# Patient Record
Sex: Male | Born: 1985 | Race: White | Hispanic: No | Marital: Single | Smoking: Former smoker
Health system: Southern US, Community
[De-identification: ages and names within clinical notes are randomized; demographics above are authoritative.]

## PROBLEM LIST (undated history)

## (undated) HISTORY — PX: EYE SURGERY: SHX253

## (undated) HISTORY — PX: APPENDECTOMY: SHX54

## (undated) HISTORY — PX: FRACTURE SURGERY: SHX138

## (undated) HISTORY — PX: RECTAL SURGERY: SHX760

---

## 2015-10-28 DIAGNOSIS — A419 Sepsis, unspecified organism: Secondary | ICD-10-CM | POA: Diagnosis not present

## 2015-10-28 DIAGNOSIS — K610 Anal abscess: Secondary | ICD-10-CM | POA: Diagnosis not present

## 2020-01-10 ENCOUNTER — Emergency Department: Payer: BLUE CROSS/BLUE SHIELD

## 2020-01-10 ENCOUNTER — Emergency Department
Admission: EM | Admit: 2020-01-10 | Discharge: 2020-01-10 | Disposition: A | Discharge status: Home / Self Care | Payer: BLUE CROSS/BLUE SHIELD | Attending: Emergency Medicine | Admitting: Emergency Medicine

## 2020-01-10 ENCOUNTER — Encounter: Payer: Self-pay | Admitting: Emergency Medicine

## 2020-01-10 ENCOUNTER — Other Ambulatory Visit: Payer: Self-pay

## 2020-01-10 DIAGNOSIS — R42 Dizziness and giddiness: Secondary | ICD-10-CM | POA: Insufficient documentation

## 2020-01-10 DIAGNOSIS — Z87891 Personal history of nicotine dependence: Secondary | ICD-10-CM | POA: Diagnosis not present

## 2020-01-10 DIAGNOSIS — R0789 Other chest pain: Secondary | ICD-10-CM | POA: Insufficient documentation

## 2020-01-10 DIAGNOSIS — M5412 Radiculopathy, cervical region: Secondary | ICD-10-CM | POA: Insufficient documentation

## 2020-01-10 DIAGNOSIS — R079 Chest pain, unspecified: Secondary | ICD-10-CM | POA: Diagnosis present

## 2020-01-10 LAB — BASIC METABOLIC PANEL
Anion gap: 10 (ref 5–15)
BUN: 15 mg/dL (ref 6–20)
CO2: 27 mmol/L (ref 22–32)
Calcium: 9.3 mg/dL (ref 8.9–10.3)
Chloride: 101 mmol/L (ref 98–111)
Creatinine, Ser: 0.84 mg/dL (ref 0.61–1.24)
GFR calc Af Amer: >60 mL/min (ref >60)
GFR calc non Af Amer: >60 mL/min (ref >60)
Glucose, Bld: 89 mg/dL (ref 70–99)
Potassium: 3.9 mmol/L (ref 3.5–5.1)
Sodium: 138 mmol/L (ref 135–145)

## 2020-01-10 LAB — CBC
HCT: 46.8 % (ref 39.0–52.0)
Hemoglobin: 16.3 g/dL (ref 13.0–17.0)
MCH: 30.5 pg (ref 26.0–34.0)
MCHC: 34.8 g/dL (ref 30.0–36.0)
MCV: 87.6 fL (ref 80.0–100.0)
Platelets: 244 10*3/uL (ref 150–400)
RBC: 5.34 MIL/uL (ref 4.22–5.81)
RDW: 13.1 % (ref 11.5–15.5)
WBC: 7.6 10*3/uL (ref 4.0–10.5)
nRBC: 0 % (ref 0.0–0.2)

## 2020-01-10 LAB — TROPONIN I (HIGH SENSITIVITY): Troponin I (High Sensitivity): <2 ng/L (ref <18)

## 2020-01-10 MED ORDER — METHYLPREDNISOLONE 4 MG PO TABS: ORAL_TABLET | ORAL | 0 refills | Status: DC

## 2020-01-10 NOTE — ED Provider Notes (Addendum)
[facility] Emergency Department Provider Note ____________________________________________   First MD Initiated Contact with Patient 01/10/20 1545     (approximate)  I have reviewed the triage vital signs and the nursing notes.   HISTORY  Chief Complaint Chest Pain    HPI Ryan Griffith is a 34 y.o. male with PMH as noted below who presents with left-sided chest, arm, and leg pain over the last week, occurring intermittently, described as shooting pains.  He states he also has pain in the neck and feels like the pain to the chest and arm radiates from this.  He feels that the arm and leg are weaker and feels somewhat numb.  He denies any associated shortness of breath or nausea, but states that he sometimes feels dizzy when he has an episode of the pain.  He states that he was seen at an outside hospital several days ago and had a negative work-up including blood, chest x-ray, and a CAT scan.  The patient denies any recent trauma or injury.  History reviewed. No pertinent past medical history.  There are no problems to display for this patient.   Past Surgical History:  Procedure Laterality Date  . APPENDECTOMY    . EYE SURGERY    . FRACTURE SURGERY     hand  . RECTAL SURGERY      Prior to Admission medications   Medication Sig Start Date End Date Taking? Authorizing Provider  methylPREDNISolone (MEDROL) 4 MG tablet 6 tablets (24mg ) PO on day 1, 5 tabs (20mg ) on day 2, 4 tabs (16mg ) on day 3, 3 tabs (12mg ) on day 4, 2 tabs (8mg ) on day 5, 1 tab (4mg ) on day 6 01/10/20   , MD    Allergies Patient has no known allergies.  No family history on file.  Social History Social History   Tobacco Use  . Smoking status: Former  . Smokeless tobacco: Never Used  Substance Use Topics  . Alcohol use: Never  . Drug use: Not on file    Review of Systems  Constitutional: No fever/chills. Eyes: No visual changes. ENT: No  sore throat.  Positive for neck pain. Cardiovascular: Positive for chest pain. Respiratory: Denies shortness of breath. Gastrointestinal: No vomiting or diarrhea.  Genitourinary: Negative for flank pain.  Musculoskeletal: Negative for back pain.  Positive for left arm and leg pain. Skin: Negative for rash. Neurological: Positive for left arm and leg weakness and numbness.   ____________________________________________   PHYSICAL EXAM:  VITAL SIGNS: ED Triage Vitals  Enc Vitals Group     BP 01/10/20 1415 130/86     Pulse Rate 01/10/20 1415 86     Resp 01/10/20 1415 20     Temp 01/10/20 1415 98.3 F (36.8 C)     Temp Source 01/10/20 1415 Oral     SpO2 01/10/20 1415 96 %     Weight 01/10/20 1410 285 lb (129.3 kg)     Height 01/10/20 1410 6\' 1"  (1.854 m)     Head Circumference --      Peak Flow --      Pain Score 01/10/20 1410 7     Pain Loc --      Pain Edu? --      Excl. in GC? --     Constitutional: Alert and oriented. Well appearing and in no acute distress. Eyes: Conjunctivae are normal.  Head: Atraumatic. Nose: No congestion/rhinnorhea. Mouth/Throat: Mucous membranes are moist.   Neck: Normal  range of motion.  Cardiovascular: Normal rate, regular rhythm. Grossly normal heart sounds.  Good peripheral circulation. Respiratory: Normal respiratory effort.  No retractions. Lungs CTAB. Gastrointestinal:  No distention.  Genitourinary: No CVA tenderness. Musculoskeletal: No lower extremity edema.  Extremities warm and well perfused.  Full range of motion. Neurologic:  Normal speech and language.  4/5 grip strength to left arm.  5/5 strength of bilateral lower extremities.   Skin:  Skin is warm and dry. No rash noted. Psychiatric: Mood and affect are normal. Speech and behavior are normal.  ____________________________________________   LABS (all labs ordered are listed, but only abnormal results are displayed)  Labs Reviewed  BASIC METABOLIC PANEL  CBC  TROPONIN I  (HIGH SENSITIVITY)   ____________________________________________  EKG  ED ECG REPORT I, Dionne Bucy, the attending physician, personally viewed and interpreted this ECG.  Date: 01/10/2020 EKG Time: 1406 Rate: 89 Rhythm: normal sinus rhythm QRS Axis: normal Intervals: normal ST/T Wave abnormalities: normal Narrative Interpretation: no evidence of acute ischemia  ____________________________________________  RADIOLOGY  CXR: No focal infiltrate or edema MR cervical spine: Mild spondylosis with no significant canal or foraminal stenosis.  ____________________________________________   PROCEDURES  Procedure(s) performed: No  Procedures  Critical Care performed: No ____________________________________________   INITIAL IMPRESSION / ASSESSMENT AND PLAN / ED COURSE  Pertinent labs & imaging results that were available during my care of the patient were reviewed by me and considered in my medical decision making (see chart for details).  34 year old male with PMH as noted below presents with left-sided chest pain over the last week associated with arm and leg pain as well as some weakness and numbness in those extremities.  He reports that he had a negative work-up few days ago at an outside hospital.  I reviewed the past medical records in [place].  I am unable to see any records from his recent visit at [facility].  He was seen at [facility] for chest pain in December of last year with a negative work-up including a CT scan.  However, he states that that episode was quite different than what he is experiencing now.  On exam the patient is overall well-appearing.  His vital signs are normal.  The physical exam is remarkable for mild decreased grip strength in the left hand but no significant motor findings in the lower extremities.  The patient reports subjective numbness intermittently in both left-sided extremities.  Initial lab work-up including troponin as  well as chest x-ray and EKG are all within normal limits.  Overall presentation is not consistent with a cardiac or pulmonary etiology.  The patient is low risk for ACS and will have been ruled out twice.  He is PERC negative and self-reports a CT scan being done this week which was negative.  Overall I suspect cervical radiculopathy versus possible musculoskeletal etiology.  Given that the primary complaint is pain and the patient has no acute neurologic findings except for mild decreased left grip strength, there is no evidence of CNS cause.  We will obtain an MRI of the cervical spine for further evaluation.  ----------------------------------------- 7:31 PM on 01/10/2020 -----------------------------------------  MRI of the cervical spine is negative for concerning acute findings.  I discussed the patient's clinical presentation and the results of the work-up with Dr. Adriana Simas from neurosurgery.  He recommends placing the patient on a Medrol Dosepak, with follow-up within the next week.  I discussed the results of the work-up and the recommended plan of care with  the patient.  He is stable for discharge home at this time.  Return precautions given, and he expresses understanding.  ____________________________________________   FINAL CLINICAL IMPRESSION(S) / ED DIAGNOSES  Final diagnoses:  Cervical radiculopathy  Atypical chest pain      NEW MEDICATIONS STARTED DURING THIS VISIT:  New Prescriptions   METHYLPREDNISOLONE (MEDROL) 4 MG TABLET    6 tablets (24mg ) PO on day 1, 5 tabs (20mg ) on day 2, 4 tabs (16mg ) on day 3, 3 tabs (12mg ) on day 4, 2 tabs (8mg ) on day 5, 1 tab (4mg ) on day 6     Note:  This document was prepared using Dragon voice recognition software and may include unintentional dictation errors.    , MD 01/10/20    , MD 01/10/20 [phone]

## 2020-01-10 NOTE — Discharge Instructions (Signed)
Take the steroid tablets as prescribed over the next 6 days.  Return to the ER for new, worsening, or persistent severe pain, weakness or numbness to the left arm or leg, vision changes, difficulty speaking, dizziness or lightheadedness, difficulty walking, or any other new or worsening symptoms that concern you.  You should follow-up with the neurosurgeon in approximately 1 week.  If you do not receive a phone call from Dr. Patsey Berthold office within the next few days, call at the number provided in this paperwork to arrange a follow-up appointment.

## 2020-01-10 NOTE — ED Notes (Signed)
See triage note  Presents with pain to left side of body  States pain has been going on fro several days   Has been seen times 1 for same  States pain cont's   No fever or n/v

## 2020-01-10 NOTE — ED Triage Notes (Signed)
Patient to ER for c/o chest pain with left-sided body pain (entire left side). Patient states chest pain began last Monday, was seen at [facility] with all normal results. Denies N/V. +Shob.

## 2020-05-27 HISTORY — PX: APPENDECTOMY: SHX54

## 2020-11-15 ENCOUNTER — Other Ambulatory Visit: Payer: Self-pay

## 2020-11-15 ENCOUNTER — Encounter (HOSPITAL_COMMUNITY): Payer: Self-pay | Admitting: Emergency Medicine

## 2020-11-15 ENCOUNTER — Emergency Department (HOSPITAL_COMMUNITY): Payer: BLUE CROSS/BLUE SHIELD

## 2020-11-15 ENCOUNTER — Emergency Department (HOSPITAL_COMMUNITY)
Admission: EM | Admit: 2020-11-15 | Discharge: 2020-11-16 | Discharge status: Against Medical Advice | Payer: BLUE CROSS/BLUE SHIELD | Attending: Emergency Medicine | Admitting: Emergency Medicine

## 2020-11-15 DIAGNOSIS — R1011 Right upper quadrant pain: Secondary | ICD-10-CM

## 2020-11-15 DIAGNOSIS — K829 Disease of gallbladder, unspecified: Secondary | ICD-10-CM | POA: Insufficient documentation

## 2020-11-15 DIAGNOSIS — Z5321 Procedure and treatment not carried out due to patient leaving prior to being seen by health care provider: Secondary | ICD-10-CM | POA: Insufficient documentation

## 2020-11-15 LAB — CBC WITH DIFFERENTIAL/PLATELET
Abs Immature Granulocytes: 0.04 10*3/uL (ref 0.00–0.07)
Basophils Absolute: 0 10*3/uL (ref 0.0–0.1)
Basophils Relative: 0 %
Eosinophils Absolute: 0.1 10*3/uL (ref 0.0–0.5)
Eosinophils Relative: 1 %
HCT: 46.8 % (ref 39.0–52.0)
Hemoglobin: 16 g/dL (ref 13.0–17.0)
Immature Granulocytes: 0 %
Lymphocytes Relative: 26 %
Lymphs Abs: 2.4 10*3/uL (ref 0.7–4.0)
MCH: 29.8 pg (ref 26.0–34.0)
MCHC: 34.2 g/dL (ref 30.0–36.0)
MCV: 87.2 fL (ref 80.0–100.0)
Monocytes Absolute: 0.7 10*3/uL (ref 0.1–1.0)
Monocytes Relative: 7 %
Neutro Abs: 6 10*3/uL (ref 1.7–7.7)
Neutrophils Relative %: 66 %
Platelets: 235 10*3/uL (ref 150–400)
RBC: 5.37 MIL/uL (ref 4.22–5.81)
RDW: 14.2 % (ref 11.5–15.5)
WBC: 9.2 10*3/uL (ref 4.0–10.5)
nRBC: 0 % (ref 0.0–0.2)

## 2020-11-15 LAB — URINALYSIS, ROUTINE W REFLEX MICROSCOPIC
Bilirubin Urine: NEGATIVE
Glucose, UA: NEGATIVE mg/dL
Ketones, ur: NEGATIVE mg/dL
Leukocytes,Ua: NEGATIVE
Nitrite: NEGATIVE
Protein, ur: NEGATIVE mg/dL
Specific Gravity, Urine: 1.021 (ref 1.005–1.030)
pH: 6 (ref 5.0–8.0)

## 2020-11-15 LAB — COMPREHENSIVE METABOLIC PANEL
ALT: 26 U/L (ref 0–44)
AST: 28 U/L (ref 15–41)
Albumin: 4.2 g/dL (ref 3.5–5.0)
Alkaline Phosphatase: 50 U/L (ref 38–126)
Anion gap: 8 (ref 5–15)
BUN: 13 mg/dL (ref 6–20)
CO2: 27 mmol/L (ref 22–32)
Calcium: 9.6 mg/dL (ref 8.9–10.3)
Chloride: 104 mmol/L (ref 98–111)
Creatinine, Ser: 1.07 mg/dL (ref 0.61–1.24)
GFR, Estimated: >60 mL/min (ref >60)
Glucose, Bld: 105 mg/dL — ABNORMAL HIGH (ref 70–99)
Potassium: 3.6 mmol/L (ref 3.5–5.1)
Sodium: 139 mmol/L (ref 135–145)
Total Bilirubin: 0.7 mg/dL (ref 0.3–1.2)
Total Protein: 6.9 g/dL (ref 6.5–8.1)

## 2020-11-15 LAB — LIPASE, BLOOD: Lipase: 43 U/L (ref 11–51)

## 2020-11-15 MED ORDER — ONDANSETRON 4 MG PO TBDP: 4.0000 mg | ORAL_TABLET | Freq: Once | ORAL | Status: DC

## 2020-11-15 MED ORDER — OXYCODONE-ACETAMINOPHEN 5-325 MG PO TABS: 1.0000 | ORAL_TABLET | Freq: Once | ORAL | Status: DC

## 2020-11-15 NOTE — ED Triage Notes (Signed)
Pt  here from home with c/o abd pain was at [facility] on Monday and was told that it was his gall bladder and to return to the hospital if it got worse

## 2020-11-15 NOTE — ED Provider Notes (Signed)
Emergency Medicine Provider Triage Evaluation Note  Ryan Griffith , a 35 y.o. male  was evaluated in triage.  Pt complains of right upper quadrant abdominal pain and nausea/vomiting that began this morning.  He had been seen at [facility] earlier this week for similar symptoms and had been referred to general surgery, but he has been unable to schedule appointment with them.  His right upper quadrant abdominal pain radiates to his right subscapular region.  7/10 pain.   Given recurrent and worsening symptoms, he came to the ED for evaluation.  He states that his symptoms are worse with eating and drinking.  Denies any fevers or chills at home.  Review of Systems  Positive: RUQ abd pain, N/V Negative: Fevers/Chills  Physical Exam  BP 137/88 (BP Location: Right Arm)   Pulse 94   Temp 98.2 F (36.8 C) (Oral)   Resp 18   SpO2 96%  Gen:   Awake, no distress   Resp:  Normal effort  MSK:   Moves extremities without difficulty  Other:  Abd: TTP in RUQ. No guarding. No TTP in epigastrium or elsewhere.   Medical Decision Making  Medically screening exam initiated at 7:10 PM.  Appropriate orders placed.  Ryan Griffith was informed that the remainder of the evaluation will be completed by another provider, this initial triage assessment does not replace that evaluation, and the importance of remaining in the ED until their evaluation is complete.    Lorelee New, PA-C 11/15/20 1914    Mancel Bale, MD 11/16/20 [phone]

## 2020-11-16 NOTE — ED Notes (Signed)
Pt left due to wait time  

## 2021-02-09 ENCOUNTER — Ambulatory Visit: Payer: Self-pay | Admitting: Family Medicine

## 2021-02-09 NOTE — Progress Notes (Deleted)
  YOSHIHARU BRASSELL - 35 y.o. male MRN [id number]  Date of birth: April 09, 1986  SUBJECTIVE:  Including CC & ROS.  No chief complaint on file.   TILMAN MCCLAREN is a 35 y.o. male that is  ***.  ***   Review of Systems See HPI   HISTORY: Past Medical, Surgical, Social, and Family History Reviewed & Updated per EMR.   Pertinent Historical Findings include:  No past medical history on file.  Past Surgical History:  Procedure Laterality Date   APPENDECTOMY     EYE SURGERY     FRACTURE SURGERY     hand   RECTAL SURGERY      No family history on file.  Social History   Socioeconomic History   Marital status: Single    Spouse name: Not on file   Number of children: Not on file   Years of education: Not on file   Highest education level: Not on file  Occupational History   Not on file  Tobacco Use   Smoking status: Former   Smokeless tobacco: Never  Substance and Sexual Activity   Alcohol use: Never   Drug use: Not on file   Sexual activity: Not on file  Other Topics Concern   Not on file  Social History Narrative   Not on file   Social Determinants of Health   Financial Resource Strain: Not on file  Food Insecurity: Not on file  Transportation Needs: Not on file  Physical Activity: Not on file  Stress: Not on file  Social Connections: Not on file  Intimate Partner Violence: Not on file     PHYSICAL EXAM:  VS: There were no vitals taken for this visit. Physical Exam Gen: NAD, alert, cooperative with exam, well-appearing MSK:  ***      ASSESSMENT & PLAN:   No problem-specific Assessment & Plan notes found for this encounter.

## 2021-02-12 ENCOUNTER — Ambulatory Visit: Payer: Self-pay | Admitting: Family Medicine

## 2022-09-12 ENCOUNTER — Encounter: Payer: Self-pay | Admitting: *Deleted

## 2022-10-15 IMAGING — US US ABDOMEN LIMITED
1 series · 14 of 25 positions shown · non-contrast
Comparison: Ultrasound dated 11/13/2020.

CLINICAL DATA: 34-year-old male with right upper quadrant abdominal
pain.

EXAM:
ULTRASOUND ABDOMEN LIMITED RIGHT UPPER QUADRANT

[Series 1: us abdomen limited · 14 of 35 slices shown]
[im 1/35]
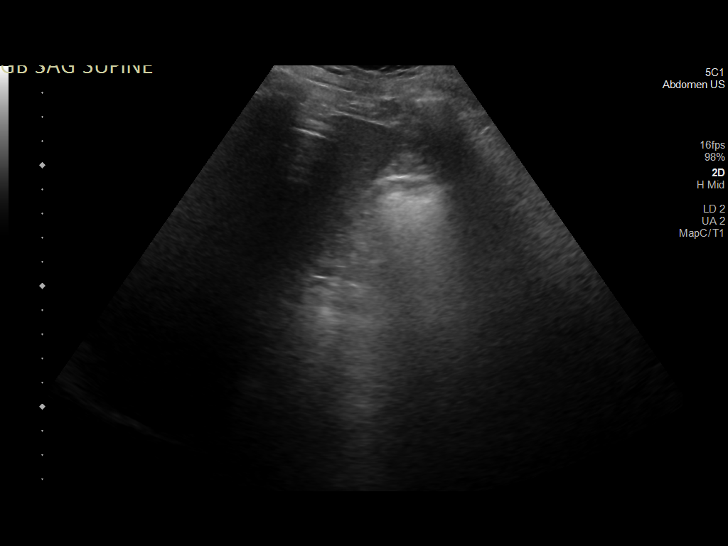
[im 3/35]
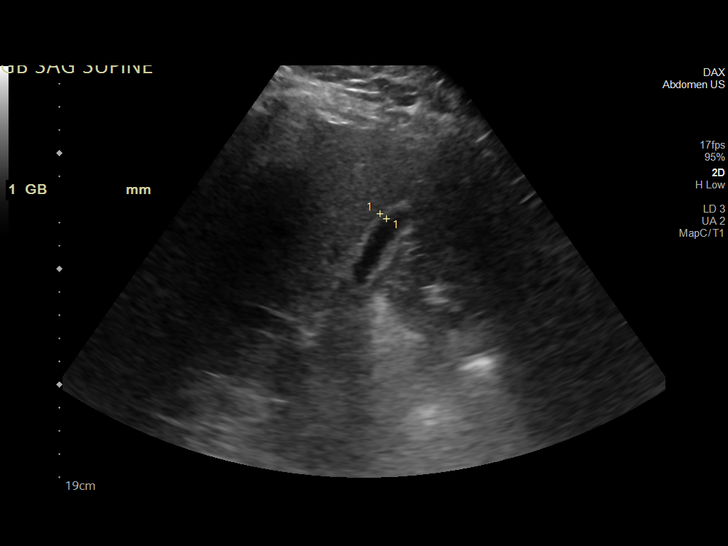
[im 6/35]
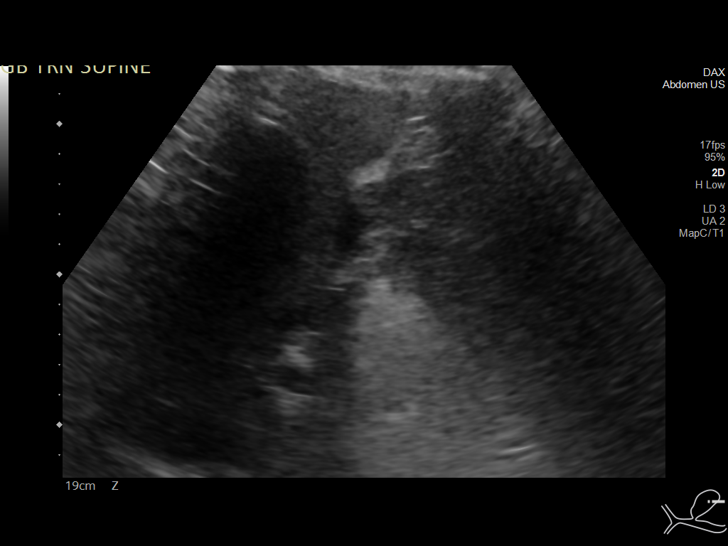
[im 9/35]
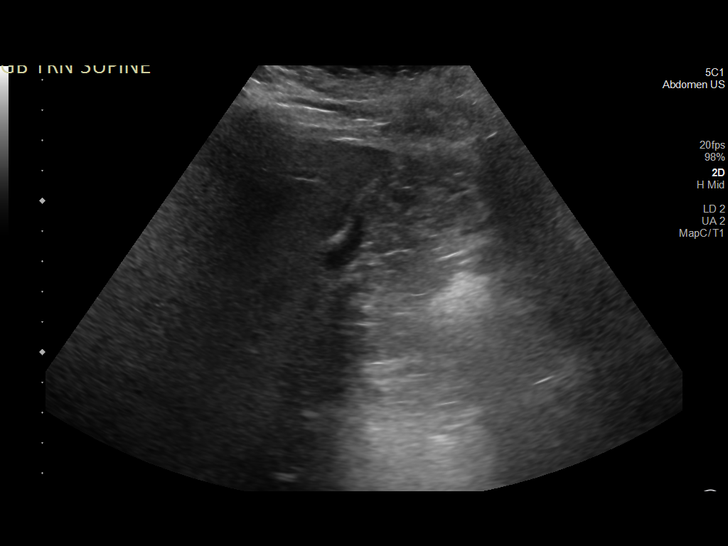
[im 12/35]
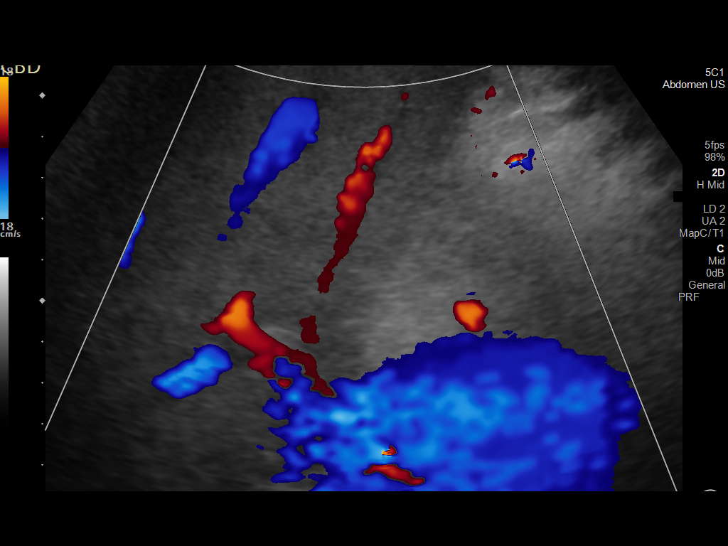
[im 13/35]
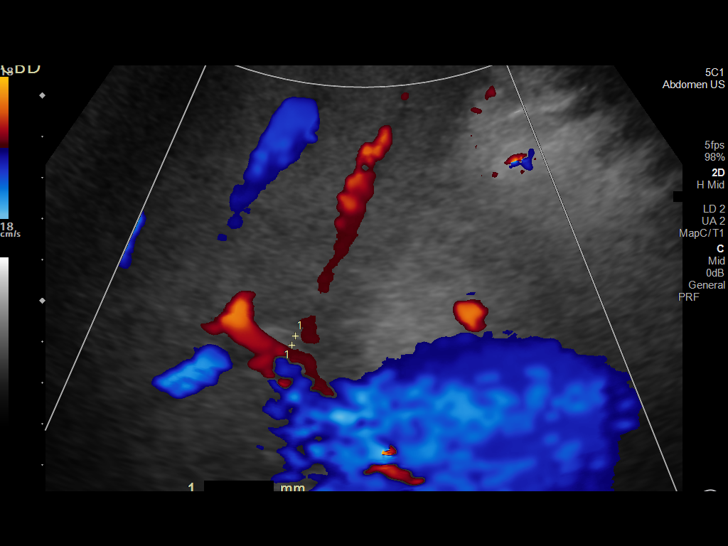
[im 16/35]
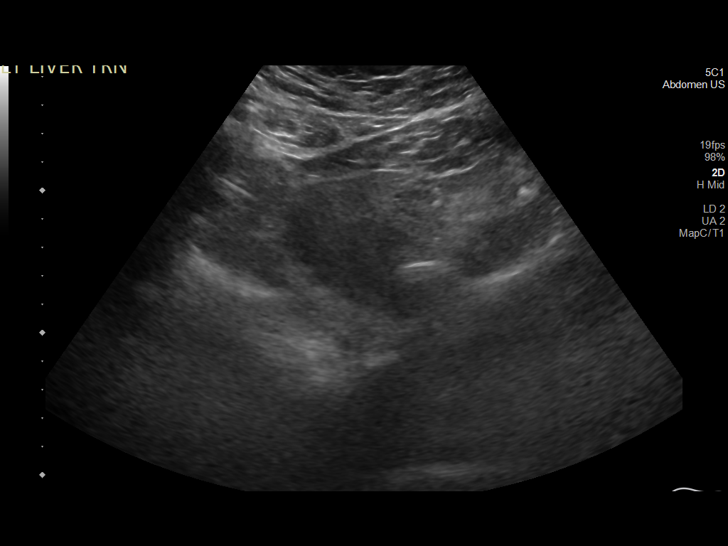
[im 19/35]
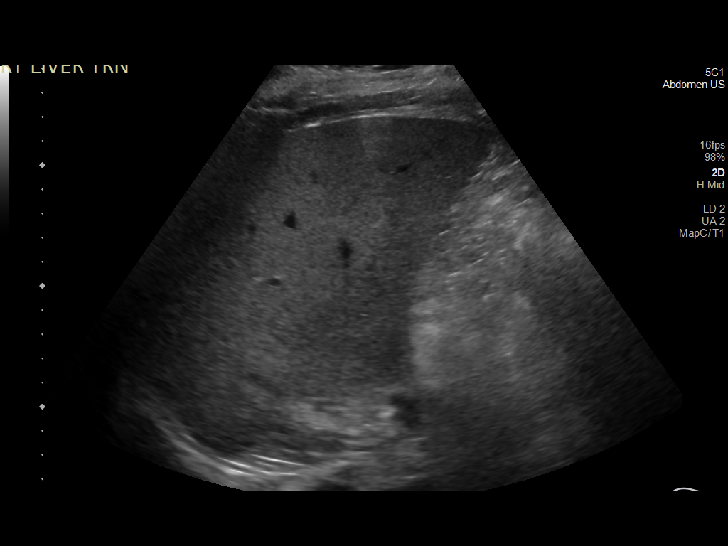
[im 22/35]
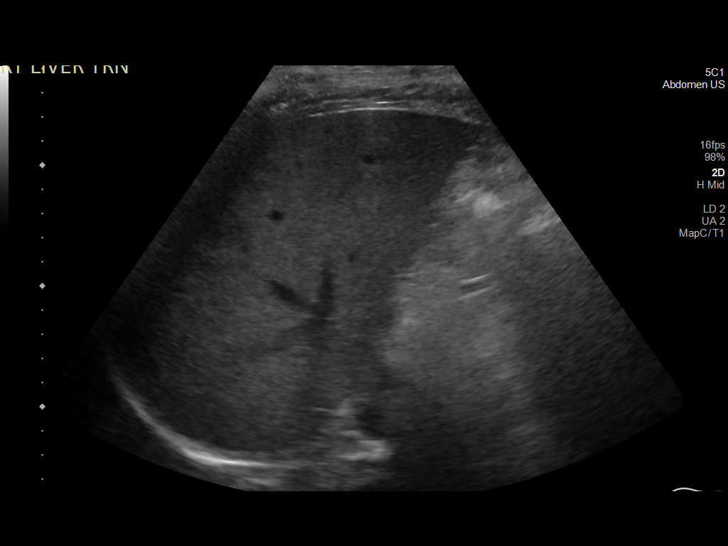
[im 23/35]
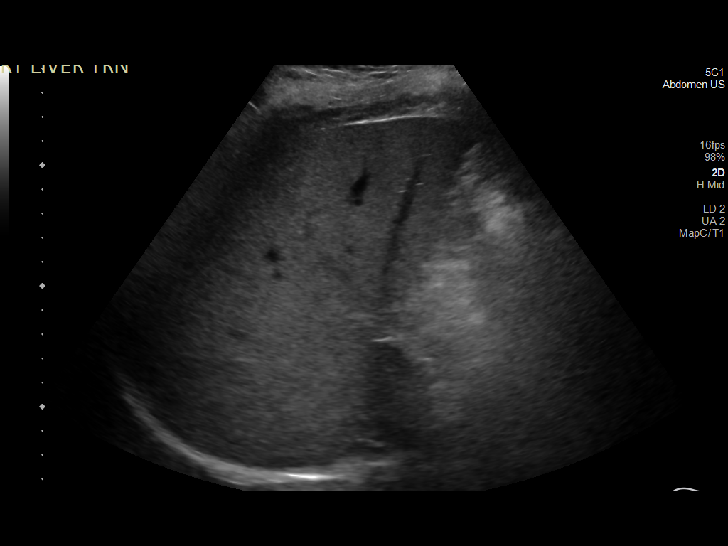
[im 26/35]
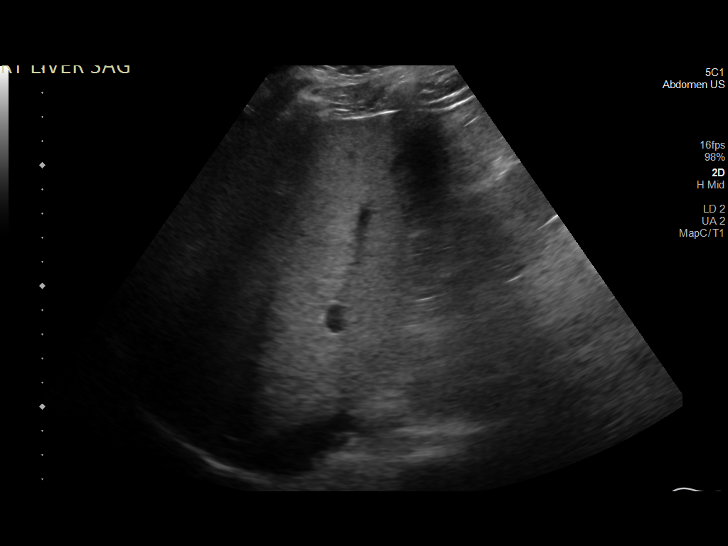
[im 29/35]
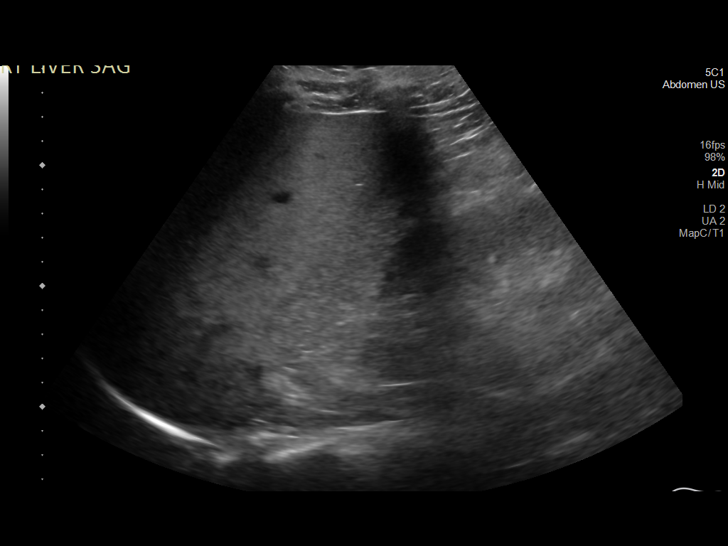
[im 32/35]
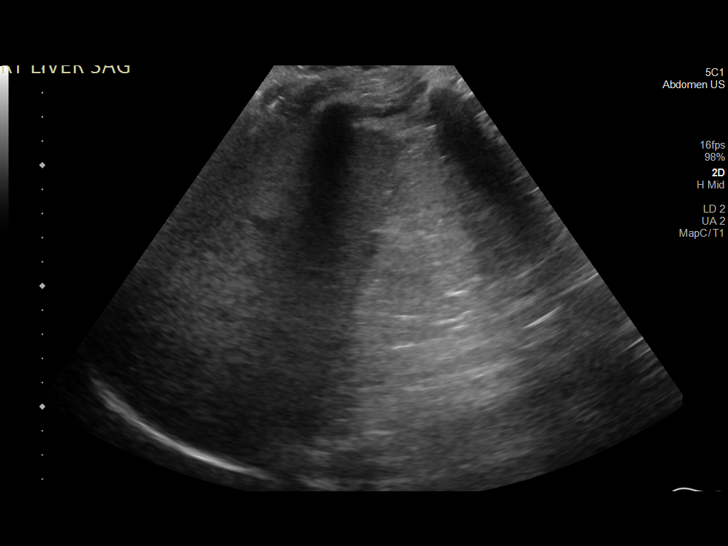
[im 35/35]
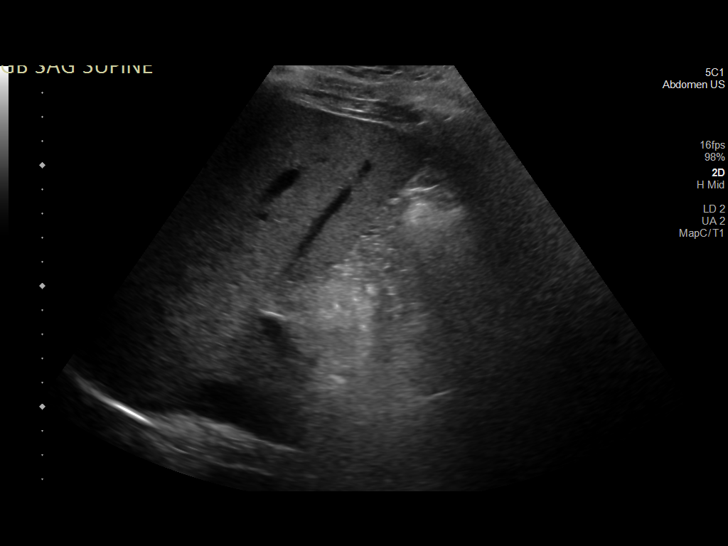

[14 of 25 positions shown; findings below may reference images not displayed]

FINDINGS: Gallbladder:

No gallstones or wall thickening visualized. No sonographic Murphy
sign noted by sonographer. The gallbladder is predominantly
contracted.

Common bile duct:

Diameter: 3 mm

Liver:

There is diffuse increased liver echogenicity most commonly seen in
the setting of fatty infiltration. Superimposed inflammation or
fibrosis is not excluded. Clinical correlation is recommended.
Portal vein is patent on color Doppler imaging with normal direction
of blood flow towards the liver.

Other: None.
IMPRESSION: Fatty liver, otherwise unremarkable right upper quadrant ultrasound.

## 2023-08-12 ENCOUNTER — Other Ambulatory Visit: Payer: Self-pay

## 2023-08-12 ENCOUNTER — Inpatient Hospital Stay (HOSPITAL_COMMUNITY)
Admission: EM | Admit: 2023-08-12 | Discharge: 2023-08-15 | DRG: 092 | Disposition: A | Discharge status: Home / Self Care | Payer: Self-pay | Attending: Internal Medicine | Admitting: Internal Medicine

## 2023-08-12 ENCOUNTER — Encounter (HOSPITAL_COMMUNITY): Payer: Self-pay

## 2023-08-12 DIAGNOSIS — R000 Tachycardia, unspecified: Secondary | ICD-10-CM | POA: Diagnosis present

## 2023-08-12 DIAGNOSIS — F40240 Claustrophobia: Secondary | ICD-10-CM | POA: Diagnosis present

## 2023-08-12 DIAGNOSIS — R509 Fever, unspecified: Secondary | ICD-10-CM | POA: Diagnosis present

## 2023-08-12 DIAGNOSIS — E871 Hypo-osmolality and hyponatremia: Principal | ICD-10-CM | POA: Diagnosis present

## 2023-08-12 DIAGNOSIS — Z9049 Acquired absence of other specified parts of digestive tract: Secondary | ICD-10-CM

## 2023-08-12 DIAGNOSIS — M25512 Pain in left shoulder: Secondary | ICD-10-CM | POA: Diagnosis present

## 2023-08-12 DIAGNOSIS — F17290 Nicotine dependence, other tobacco product, uncomplicated: Secondary | ICD-10-CM | POA: Diagnosis present

## 2023-08-12 DIAGNOSIS — R200 Anesthesia of skin: Secondary | ICD-10-CM | POA: Diagnosis not present

## 2023-08-12 DIAGNOSIS — Z6841 Body Mass Index (BMI) 40.0 and over, adult: Secondary | ICD-10-CM

## 2023-08-12 DIAGNOSIS — R030 Elevated blood-pressure reading, without diagnosis of hypertension: Secondary | ICD-10-CM | POA: Diagnosis present

## 2023-08-12 DIAGNOSIS — Z1152 Encounter for screening for COVID-19: Secondary | ICD-10-CM

## 2023-08-12 DIAGNOSIS — Z8719 Personal history of other diseases of the digestive system: Secondary | ICD-10-CM

## 2023-08-12 DIAGNOSIS — R29818 Other symptoms and signs involving the nervous system: Secondary | ICD-10-CM | POA: Diagnosis not present

## 2023-08-12 DIAGNOSIS — R0789 Other chest pain: Secondary | ICD-10-CM | POA: Diagnosis present

## 2023-08-12 DIAGNOSIS — E66813 Obesity, class 3: Secondary | ICD-10-CM | POA: Diagnosis present

## 2023-08-12 DIAGNOSIS — R519 Headache, unspecified: Secondary | ICD-10-CM | POA: Diagnosis present

## 2023-08-12 DIAGNOSIS — D72829 Elevated white blood cell count, unspecified: Secondary | ICD-10-CM | POA: Diagnosis present

## 2023-08-12 DIAGNOSIS — R2990 Unspecified symptoms and signs involving the nervous system: Secondary | ICD-10-CM | POA: Diagnosis present

## 2023-08-12 DIAGNOSIS — R0683 Snoring: Secondary | ICD-10-CM | POA: Diagnosis present

## 2023-08-12 DIAGNOSIS — R110 Nausea: Secondary | ICD-10-CM | POA: Diagnosis present

## 2023-08-12 MED ORDER — ACETAMINOPHEN 325 MG PO TABS
650.0000 mg | ORAL_TABLET | Freq: Four times a day (QID) | ORAL | Status: DC | PRN
  Administered 2023-08-12: 650 mg via ORAL
  Filled 2023-08-12: qty 2

## 2023-08-12 MED ORDER — ONDANSETRON HCL 4 MG/2ML IJ SOLN
4.0000 mg | Freq: Once | INTRAMUSCULAR | Status: AC
  Administered 2023-08-12: 4 mg via INTRAVENOUS
  Filled 2023-08-12: qty 2

## 2023-08-12 NOTE — ED Triage Notes (Signed)
 Pt BIB [facility] d/t Cp while driving radiating to left arm and left leg appraox 1 hour ago.  Pt has had nausea and has a fever of 100.4 in triage.  Pt was given 324 AsA PO and 1 nitroglycerin with EMS.  Pain decreased 10 to 7/10.  Also BP was 230/130 and decreased to 140/76 with EMS.  Pt states he has no hx of cardiac issues.

## 2023-08-13 ENCOUNTER — Emergency Department (HOSPITAL_COMMUNITY): Payer: Self-pay

## 2023-08-13 DIAGNOSIS — R200 Anesthesia of skin: Secondary | ICD-10-CM | POA: Diagnosis not present

## 2023-08-13 DIAGNOSIS — R2990 Unspecified symptoms and signs involving the nervous system: Secondary | ICD-10-CM | POA: Diagnosis present

## 2023-08-13 DIAGNOSIS — R531 Weakness: Secondary | ICD-10-CM | POA: Diagnosis not present

## 2023-08-13 DIAGNOSIS — R519 Headache, unspecified: Secondary | ICD-10-CM | POA: Diagnosis not present

## 2023-08-13 DIAGNOSIS — R079 Chest pain, unspecified: Secondary | ICD-10-CM | POA: Diagnosis not present

## 2023-08-13 LAB — CBC
HCT: 46.2 % (ref 39.0–52.0)
Hemoglobin: 16.5 g/dL (ref 13.0–17.0)
MCH: 31 pg (ref 26.0–34.0)
MCHC: 35.7 g/dL (ref 30.0–36.0)
MCV: 86.7 fL (ref 80.0–100.0)
Platelets: 240 10*3/uL (ref 150–400)
RBC: 5.33 MIL/uL (ref 4.22–5.81)
RDW: 13.2 % (ref 11.5–15.5)
WBC: 14 10*3/uL — ABNORMAL HIGH (ref 4.0–10.5)
nRBC: 0 % (ref 0.0–0.2)

## 2023-08-13 LAB — RAPID URINE DRUG SCREEN, HOSP PERFORMED
Amphetamines: NOT DETECTED
Barbiturates: NOT DETECTED
Benzodiazepines: NOT DETECTED
Cocaine: NOT DETECTED
Opiates: POSITIVE — AB
Tetrahydrocannabinol: NOT DETECTED

## 2023-08-13 LAB — URINALYSIS, ROUTINE W REFLEX MICROSCOPIC
Bacteria, UA: NONE SEEN
Bilirubin Urine: NEGATIVE
Glucose, UA: NEGATIVE mg/dL
Hgb urine dipstick: NEGATIVE
Ketones, ur: NEGATIVE mg/dL
Leukocytes,Ua: NEGATIVE
Nitrite: NEGATIVE
Protein, ur: NEGATIVE mg/dL
Specific Gravity, Urine: >1.046 — ABNORMAL HIGH (ref 1.005–1.030)
pH: 5 (ref 5.0–8.0)

## 2023-08-13 LAB — RESP PANEL BY RT-PCR (RSV, FLU A&B, COVID)  RVPGX2
Influenza A by PCR: NEGATIVE
Influenza B by PCR: NEGATIVE
Resp Syncytial Virus by PCR: NEGATIVE
SARS Coronavirus 2 by RT PCR: NEGATIVE

## 2023-08-13 LAB — BASIC METABOLIC PANEL
Anion gap: 11 (ref 5–15)
BUN: 17 mg/dL (ref 6–20)
CO2: 23 mmol/L (ref 22–32)
Calcium: 9.1 mg/dL (ref 8.9–10.3)
Chloride: 100 mmol/L (ref 98–111)
Creatinine, Ser: 1.12 mg/dL (ref 0.61–1.24)
GFR, Estimated: >60 mL/min (ref >60)
Glucose, Bld: 100 mg/dL — ABNORMAL HIGH (ref 70–99)
Potassium: 3.9 mmol/L (ref 3.5–5.1)
Sodium: 134 mmol/L — ABNORMAL LOW (ref 135–145)

## 2023-08-13 LAB — TROPONIN I (HIGH SENSITIVITY)
Troponin I (High Sensitivity): 5 ng/L (ref <18)
Troponin I (High Sensitivity): 6 ng/L (ref <18)

## 2023-08-13 LAB — HIV ANTIBODY (ROUTINE TESTING W REFLEX): HIV Screen 4th Generation wRfx: NONREACTIVE

## 2023-08-13 LAB — GLUCOSE, CAPILLARY: Glucose-Capillary: 102 mg/dL — ABNORMAL HIGH (ref 70–99)

## 2023-08-13 MED ORDER — ACETAMINOPHEN 325 MG PO TABS
650.0000 mg | ORAL_TABLET | Freq: Four times a day (QID) | ORAL | Status: DC | PRN
Start: 2023-08-13 — End: 2023-08-15
  Administered 2023-08-13 (×2): 650 mg via ORAL
  Filled 2023-08-13 (×2): qty 2

## 2023-08-13 MED ORDER — HYDROMORPHONE HCL 1 MG/ML IJ SOLN
1.0000 mg | Freq: Once | INTRAMUSCULAR | Status: AC
  Administered 2023-08-13: 1 mg via INTRAVENOUS
  Filled 2023-08-13: qty 1

## 2023-08-13 MED ORDER — HYDROMORPHONE HCL 1 MG/ML IJ SOLN
1.0000 mg | INTRAMUSCULAR | Status: DC | PRN
  Administered 2023-08-13 – 2023-08-14 (×7): 1 mg via INTRAVENOUS
  Filled 2023-08-13 (×7): qty 1

## 2023-08-13 MED ORDER — IOHEXOL 350 MG/ML SOLN
100.0000 mL | Freq: Once | INTRAVENOUS | Status: AC | PRN
  Administered 2023-08-13: 100 mL via INTRAVENOUS

## 2023-08-13 MED ORDER — SODIUM CHLORIDE 0.9% FLUSH
3.0000 mL | Freq: Two times a day (BID) | INTRAVENOUS | Status: DC
  Administered 2023-08-14 – 2023-08-15 (×2): 3 mL via INTRAVENOUS

## 2023-08-13 MED ORDER — LORAZEPAM 2 MG/ML IJ SOLN
2.0000 mg | Freq: Once | INTRAMUSCULAR | Status: AC
  Administered 2023-08-13: 2 mg via INTRAVENOUS
  Filled 2023-08-13: qty 1

## 2023-08-13 MED ORDER — DEXTROSE 5 % IV SOLN
10.0000 mg/kg | Freq: Three times a day (TID) | INTRAVENOUS | Status: DC
  Administered 2023-08-13 – 2023-08-14 (×2): 1060 mg via INTRAVENOUS
  Filled 2023-08-13: qty 10
  Filled 2023-08-13: qty 20
  Filled 2023-08-13 (×2): qty 21.2

## 2023-08-13 MED ORDER — GUAIFENESIN-DM 100-10 MG/5ML PO SYRP
10.0000 mL | ORAL_SOLUTION | ORAL | Status: DC | PRN
  Administered 2023-08-13 – 2023-08-14 (×2): 10 mL via ORAL
  Filled 2023-08-13 (×3): qty 10

## 2023-08-13 MED ORDER — LACTATED RINGERS IV BOLUS
1000.0000 mL | Freq: Once | INTRAVENOUS | Status: AC
  Administered 2023-08-13: 1000 mL via INTRAVENOUS

## 2023-08-13 MED ORDER — LORAZEPAM 2 MG/ML IJ SOLN: 2.0000 mg | Freq: Once | INTRAMUSCULAR | Status: DC

## 2023-08-13 MED ORDER — VANCOMYCIN HCL 10 G IV SOLR
2500.0000 mg | Freq: Once | INTRAVENOUS | Status: AC
  Administered 2023-08-13: 2500 mg via INTRAVENOUS
  Filled 2023-08-13: qty 2500

## 2023-08-13 MED ORDER — ONDANSETRON HCL 4 MG/2ML IJ SOLN
4.0000 mg | Freq: Four times a day (QID) | INTRAMUSCULAR | Status: DC | PRN
  Filled 2023-08-13: qty 2

## 2023-08-13 MED ORDER — SODIUM CHLORIDE 0.9 % IV SOLN: INTRAVENOUS | Status: DC

## 2023-08-13 MED ORDER — ONDANSETRON HCL 4 MG PO TABS: 4.0000 mg | ORAL_TABLET | Freq: Four times a day (QID) | ORAL | Status: DC | PRN

## 2023-08-13 MED ORDER — SODIUM CHLORIDE 0.9 % IV SOLN
2.0000 g | Freq: Two times a day (BID) | INTRAVENOUS | Status: DC
  Administered 2023-08-13 – 2023-08-14 (×3): 2 g via INTRAVENOUS
  Filled 2023-08-13 (×2): qty 20

## 2023-08-13 MED ORDER — VANCOMYCIN HCL 1500 MG/300ML IV SOLN
1500.0000 mg | Freq: Three times a day (TID) | INTRAVENOUS | Status: DC
  Administered 2023-08-14 (×2): 1500 mg via INTRAVENOUS
  Filled 2023-08-13 (×3): qty 300

## 2023-08-13 MED ORDER — KETOROLAC TROMETHAMINE 30 MG/ML IJ SOLN
15.0000 mg | Freq: Once | INTRAMUSCULAR | Status: AC
  Administered 2023-08-13: 15 mg via INTRAVENOUS
  Filled 2023-08-13: qty 1

## 2023-08-13 NOTE — ED Notes (Signed)
Neurology at beside.

## 2023-08-13 NOTE — Consult Note (Signed)
 NEUROLOGY CONSULT NOTE   Date of service: August 13, 2023 Patient Name: Ryan Griffith MRN:  [id number] DOB:  27-Mar-1986 Chief Complaint: "Headache, left arm weakness and numbness" Requesting Provider: Glade Lloyd, MD  History of Present Illness  Ryan Griffith is a 38 y.o. male with remote hx of appendicitis status post appendectomy who presents with sudden onset 8 out of 10 bifrontal headache that he describes as stabbing pain along with stabbing chest pain and left arm weakness and numbness.  He reports that he was driving around 4098 on 06/14/1476 when his symptoms occurred suddenly.  He reported nausea, was noted to be febrile with a fever of 100.4 in triage.  He was noted to be hypertensive on arrival with a systolic of 230s.  He denies any prior history of similar symptoms.  He does endorse feeling feverish and warm at home.  Here, his Tmax is 100.8 despite getting Tylenol.  He denies any recent URI-like symptoms including no sore throat, no runny nose,, no mucus production.  He denies any tick bites.  He denies using any recreational substances, he only socially drinks alcohol.  He reports that his typical diet is pizza and fast food.  He has nausea and has not seen a family doctor in a few years.  It seems like you told to admitting team that he had a headache for the last 2 to 3 weeks.  However, he denies that and tells me that his headache was sudden and maximal in onset and started at the same time as his chest pain and left-sided numbness and weakness.  LKW: 2100 on 08/12/2023 Modified rankin score: 0-Completely asymptomatic and back to baseline post- stroke IV Thrombolysis: Not offered, felt to be less likely to be stroke. EVT: Not offered, no LVO and low suspicion for stroke.     NIHSS components Score: Comment  1a Level of Conscious 0[x]  1[]  2[]  3[]      1b LOC Questions 0[x]  1[]  2[]       1c LOC Commands 0[x]  1[]  2[]       2 Best Gaze 0[x]  1[]  2[]       3 Visual  0[x]  1[]  2[]  3[]      4 Facial Palsy 0[x]  1[]  2[]  3[]      5a Motor Arm - left 0[x]  1[]  2[]  3[]  4[]  UN[]    5b Motor Arm - Right 0[x]  1[]  2[]  3[]  4[]  UN[]    6a Motor Leg - Left 0[x]  1[]  2[]  3[]  4[]  UN[]    6b Motor Leg - Right 0[x]  1[]  2[]  3[]  4[]  UN[]    7 Limb Ataxia 0[x]  1[]  2[]  3[]  UN[]     8 Sensory 0[]  1[]  2[x]  UN[]      9 Best Language 0[x]  1[]  2[]  3[]      10 Dysarthria 0[x]  1[]  2[]  UN[]      11 Extinct. and Inattention 0[x]  1[]  2[]       TOTAL: 2      ROS  Comprehensive ROS performed and pertinent positives documented in HPI   Past History  History reviewed. No pertinent past medical history.  Past Surgical History:  Procedure Laterality Date   APPENDECTOMY     EYE SURGERY     FRACTURE SURGERY     hand   RECTAL SURGERY      Family History: History reviewed. No pertinent family history.  Social History  reports that he has quit smoking. He has never used smokeless tobacco. He reports that he does not drink alcohol. No history on file for drug use.  No Known Allergies  Medications   Current Facility-Administered Medications:    acetaminophen (TYLENOL) tablet 650 mg, 650 mg, Oral, Q6H PRN, Hanley Ben, Kshitiz, MD, 650 mg at 08/13/23 1135   cefTRIAXone (ROCEPHIN) 2 g in sodium chloride 0.9 % 100 mL IVPB, 2 g, Intravenous, BID, Russella Dar, NP, Stopped at 08/13/23 1111   HYDROmorphone (DILAUDID) injection 1 mg, 1 mg, Intravenous, Q2H PRN, Russella Dar, NP, 1 mg at 08/13/23 1222   ondansetron (ZOFRAN) tablet 4 mg, 4 mg, Oral, Q6H PRN **OR** ondansetron (ZOFRAN) injection 4 mg, 4 mg, Intravenous, Q6H PRN, Russella Dar, NP   sodium chloride flush (NS) 0.9 % injection 3 mL, 3 mL, Intravenous, Q12H, Russella Dar, NP No current outpatient medications on file.  Vitals   Vitals:   08/13/23 0800 08/13/23 0900 08/13/23 1132 08/13/23 1225  BP: 116/76 115/76  133/66  Pulse: (!) 127 (!) 127  (!) 112  Resp:  20  18  Temp:   (!) 100.8 F (38.2 C)   TempSrc:   Oral    SpO2: 97% 99%  90%  Weight:      Height:        Body mass index is 42.22 kg/m.  Physical Exam   General: Holding his forehead in his hands.endorses sensitivity to light.  In no acute distress.  HENT: Normal oropharynx and mucosa. Normal external appearance of ears and nose.  Neck: Supple, no pain or tenderness  CV: No JVD. No peripheral edema.  Pulmonary: Symmetric Chest rise. Normal respiratory effort.  Abdomen: Soft to touch, non-tender.  Ext: No cyanosis, edema, or deformity  Skin: No rash. Normal palpation of skin.   Musculoskeletal: Normal digits and nails by inspection. No clubbing.   Neurologic Examination  Mental status/Cognition: Alert, oriented to self, place, month and year, good attention.  Speech/language: Fluent, comprehension intact, object naming intact, repetition intact.  Cranial nerves:   CN II Pupils equal and reactive to light, no VF deficits    CN III,IV,VI EOM intact, no gaze preference or deviation, no nystagmus    CN V normal sensation in V1, V2, and V3 segments bilaterally    CN VII no asymmetry, no nasolabial fold flattening    CN VIII normal hearing to speech    CN IX & X normal palatal elevation, no uvular deviation    CN XI 5/5 head turn and 5/5 shoulder shrug bilaterally    CN XII midline tongue protrusion    Motor:  Muscle bulk: Normal tone normal, pronator drift none tremor none Mvmt Root Nerve  Muscle Right Left Comments  SA C5/6 Ax Deltoid 5 4   EF C5/6 Mc Biceps 5 4   EE C6/7/8 Rad Triceps 5 4   WF C6/7 Med FCR     WE C7/8 PIN ECU     F Ab C8/T1 U ADM/FDI 5 4   HF L1/2/3 Fem Illopsoas 5 4   KE L2/3/4 Fem Quad 5 4   DF L4/5 D Peron Tib Ant 5 4   PF S1/2 Tibial Grc/Sol 5 4    Sensation:  Light touch Endorses absent to touch in left upper extremity left lower extremity.   Pin prick    Temperature    Vibration   Proprioception    Coordination/Complex Motor:  - Finger to Nose intact bilaterally - Heel to shin intact  bilaterally - Rapid alternating movement are slower on the left - Gait: Deferred for patient safety. Labs/Imaging/Neurodiagnostic studies   CBC:  Recent Labs  Lab 08/12/23 2353  WBC 14.0*  HGB 16.5  HCT 46.2  MCV 86.7  PLT 240   Basic Metabolic Panel:  Lab Results  Component Value Date   NA 134 (L) 08/12/2023   K 3.9 08/12/2023   CO2 23 08/12/2023   GLUCOSE 100 (H) 08/12/2023   BUN 17 08/12/2023   CREATININE 1.12 08/12/2023   CALCIUM 9.1 08/12/2023   GFRNONAA >60 08/12/2023   GFRAA >60 01/10/2020   Lipid Panel: No results found for: "LDLCALC" HgbA1c: No results found for: "HGBA1C" Urine Drug Screen:     Component Value Date/Time   LABOPIA POSITIVE (A) 08/13/2023 0755   COCAINSCRNUR NONE DETECTED 08/13/2023 0755   LABBENZ NONE DETECTED 08/13/2023 0755   AMPHETMU NONE DETECTED 08/13/2023 0755   THCU NONE DETECTED 08/13/2023 0755   LABBARB NONE DETECTED 08/13/2023 0755    Alcohol Level No results found for: "ETH" INR No results found for: "INR" APTT No results found for: "APTT" AED levels: No results found for: "PHENYTOIN", "ZONISAMIDE", "LAMOTRIGINE", "LEVETIRACETA"  CT Head without contrast(Personally reviewed): CTH was negative for a large hypodensity concerning for a large territory infarct or hyperdensity concerning for an ICH  CT angio Head and Neck with contrast(Personally reviewed): No LVO, no aneurysm, no vasospasm noted.  MRI Brain: Pending  ASSESSMENT   FARRELL PANTALEO is a 38 y.o. male with remote hx of appendicitis status post appendectomy who presents with sudden onset 8 out of 10 bifrontal headache that he describes as stabbing pain maximal at onset along with stabbing chest pain and left arm weakness and numbness.  Neuroexam notable for effort dependent left arm and left leg weakness and numbness. He endorses significant photophobia. No prior similar headache. No hx of MS, no hx of dissection.  Differential is quite broad, aortic  dissection has been ruled out with a CT chest abdomen pelvis.  Potential subarachnoid hemorrhage or RCVS is still on the differential but with the fact that he had a CT head within 6 hours of onset of symptoms which was negative for hemorrhage, low overall suspicion for an aneurysmal subarachnoid hemorrhage.  CT angio the head and neck also did not show any significant vasospasm or any aneurysms.  MRI was attempted and Ativan, but despite Ativan on board, he was unable to tolerate MRI due to severe claustrophobia.  With this said, his weakness does appear to have a giveaway component on my exam and appears to improve when he is distracted or retesting.  This raises suspicion for there being underlying functional etiologies of his presentation.  However, I cannot ignore the fact that he does have fever and elevated white cell count on his blood work with no clear source, this maybe concerning for a potential meningitis or discitis.  His neuroexam does not show nuchal rigidity, but he does have some neck tenderness, negative Kernig's and Brudzinski's sign, he is not encephalopathic or altered in any way, he does continue to still report a bifrontal headache.  RECOMMENDATIONS  -Agree with MRI of the brain and cervical spine with and without contrast. This will need to be done under general anesthesia due to claustrophobia. -I have ordered lumbar puncture under fluoroscopy.  He will not be able to get it at the bedside with his elevated BMI of 42. IR will need to coordinate this with anesthesia team and MRI team. -CSF cell count, differential, protein, glucose, meningitis/encephalitis biofire panel, CSF gram stain and cultures, CSF opening pressure. - Will get RPR, HIV. -  Empiric meningitis coverage with Vanc, ceftriaxone, Acyclovir. Wean or narrow after LP. ______________________________________________________________________  Plan discussed with patient, his father at the bedside and with hospital team  later over secure chat.  Signed, Erick Blinks, MD [facility]

## 2023-08-13 NOTE — ED Notes (Signed)
 Assumed pt care from [facility]

## 2023-08-13 NOTE — ED Notes (Signed)
 Patient transported to MRI

## 2023-08-13 NOTE — Consult Note (Signed)
 Chief Complaint: Patient was seen in consultation today for headaches, left sided weakness and fevers.   at the request of Erick Blinks, MD  Referring Physician(s): Erick Blinks, MD  Supervising Physician: Ruel Favors  Patient Status: [facility] - In-pt  Full Code  History of Present Illness: Ryan Griffith is a 38 y.o. male without significant medical history. Patient presented to the ER 3/19 with chest pain, left sided extremity weakness and nausea. Patient underwent extensive workup, which included a EKG, CT head, CT angio head/neck, chest xray, and  CT angio chest/abdomen/pelvis. These test were unremarkable for acute findings. Neurology was consulted and recommended MRI of the brain and cervical spine with and without contrast. LP under fluoroscopy recommended. IR consulted to assist with the lumbar puncture and coordinate this with anesthesia.   Patient reports currently that he has a headache, neck pain, visual changes and a cough. He denies any recent illnesses. Patient notes having chest pain and nausea. Patient denies vomiting and abdominal pain.   History reviewed. No pertinent past medical history.  Past Surgical History:  Procedure Laterality Date   APPENDECTOMY     EYE SURGERY     FRACTURE SURGERY     hand   RECTAL SURGERY      Allergies: Patient has no known allergies.  Medications: Prior to Admission medications   Not on File     History reviewed. No pertinent family history.  Social History   Socioeconomic History   Marital status: Single    Spouse name: Not on file   Number of children: Not on file   Years of education: Not on file   Highest education level: Not on file  Occupational History   Not on file  Tobacco Use   Smoking status: Former   Smokeless tobacco: Never  Substance and Sexual Activity   Alcohol use: Never   Drug use: Not on file   Sexual activity: Not on file  Other Topics Concern   Not on file  Social  History Narrative   Not on file   Social Drivers of Health   Financial Resource Strain: Not on file  Food Insecurity: No Food Insecurity (08/13/2023)   Hunger Vital Sign    Worried About Running Out of Food in the Last Year: Never true    Ran Out of Food in the Last Year: Never true  Transportation Needs: No Transportation Needs (08/13/2023)   PRAPARE - Administrator, Civil Service (Medical): No    Lack of Transportation (Non-Medical): No  Physical Activity: Not on file  Stress: Not on file  Social Connections: Unknown (10/08/2021)   Received from [facility]   Social Network    Social Network: Not on file    ECOG Status: Review of Systems: A 12 point ROS discussed and pertinent positives are indicated in the HPI above.  All other systems are negative.  Review of Systems  Constitutional:  Positive for fever.  Eyes:  Positive for visual disturbance.  Respiratory:  Positive for cough. Negative for shortness of breath.   Cardiovascular:  Positive for chest pain.  Gastrointestinal:  Positive for nausea. Negative for diarrhea and vomiting.  Musculoskeletal:  Positive for neck pain.  Neurological:  Positive for weakness (left side) and headaches.    Vital Signs: BP 133/66 (BP Location: Left Arm)   Pulse (!) 112   Temp (!) 100.8 F (38.2 C) (Oral)   Resp 18   Ht 6\' 1"  (1.854 m)   Wt Marland Kitchen)  320 lb (145.2 kg)   SpO2 90%   BMI 42.22 kg/m     Physical Exam HENT:     Head: Normocephalic and atraumatic.  Cardiovascular:     Rate and Rhythm: Regular rhythm. Tachycardia present.  Pulmonary:     Effort: Pulmonary effort is normal.     Breath sounds: Normal breath sounds.  Skin:    General: Skin is warm.  Neurological:     Mental Status: He is alert and oriented to person, place, and time.  Psychiatric:        Mood and Affect: Mood normal.        Behavior: Behavior normal.     Imaging: CT ANGIO HEAD NECK W WO CM Result Date: 08/13/2023 CLINICAL DATA:   Initial evaluation for acute neuro deficit, stroke suspected. EXAM: CT ANGIOGRAPHY HEAD AND NECK WITH AND WITHOUT CONTRAST TECHNIQUE: Multidetector CT imaging of the head and neck was performed using the standard protocol during bolus administration of intravenous contrast. Multiplanar CT image reconstructions and MIPs were obtained to evaluate the vascular anatomy. Carotid stenosis measurements (when applicable) are obtained utilizing NASCET criteria, using the distal internal carotid diameter as the denominator. RADIATION DOSE REDUCTION: This exam was performed according to the departmental dose-optimization program which includes automated exposure control, adjustment of the mA and/or kV according to patient size and/or use of iterative reconstruction technique. CONTRAST:  OMNIPAQUE IOHEXOL 350 MG/ML SOLN COMPARISON:  Comparison made with head CT performed on the same day. FINDINGS: CTA NECK FINDINGS Aortic arch: Standard branching. Imaged portion shows no evidence of aneurysm or dissection. No significant stenosis of the major arch vessel origins. Right carotid system: No evidence of dissection, stenosis (50% or greater), or occlusion. Left carotid system: No evidence of dissection, stenosis (50% or greater), or occlusion. Vertebral arteries: No evidence of dissection, stenosis (50% or greater), or occlusion. Skeleton: No discrete or worrisome osseous lesions. Other neck: No other acute finding. Upper chest: No other acute finding. Review of the MIP images confirms the above findings CTA HEAD FINDINGS Anterior circulation: Both internal carotid arteries widely patent to the termini without stenosis. A1 segments widely patent. Normal anterior communicating artery complex. Both anterior cerebral arteries widely patent to their distal aspects without stenosis. No M1 stenosis or occlusion. Normal MCA bifurcations. Distal MCA branches well perfused and symmetric. Posterior circulation: Both V4 segments patent  to the vertebrobasilar junction without stenosis. Both PICA origins patent and normal. Basilar widely patent to its distal aspect without stenosis. Superior cerebellar arteries patent bilaterally. Both PCAs primarily supplied via the basilar and are well perfused to there distal aspects. Venous sinuses: Patent allowing for timing the contrast bolus. Anatomic variants: None significant.  No aneurysm. Review of the MIP images confirms the above findings IMPRESSION: Normal CTA of the head and neck. No large vessel occlusion, hemodynamically significant stenosis, or other acute vascular abnormality. Electronically Signed   By: Rise Mu M.D.   On: 08/13/2023 02:10   CT Head Wo Contrast Result Date: 08/13/2023 CLINICAL DATA:  Headache EXAM: CT HEAD WITHOUT CONTRAST TECHNIQUE: Contiguous axial images were obtained from the base of the skull through the vertex without intravenous contrast. RADIATION DOSE REDUCTION: This exam was performed according to the departmental dose-optimization program which includes automated exposure control, adjustment of the mA and/or kV according to patient size and/or use of iterative reconstruction technique. COMPARISON:  None Available. FINDINGS: Brain: No acute intracranial abnormality. Specifically, no hemorrhage, hydrocephalus, mass lesion, acute infarction, or significant intracranial injury. Vascular:  No hyperdense vessel or unexpected calcification. Skull: No acute calvarial abnormality. Sinuses/Orbits: No acute findings Other: None IMPRESSION: No acute intracranial abnormality. Electronically Signed   By: Charlett Nose M.D.   On: 08/13/2023 01:32   CT Angio Chest/Abd/Pel for Dissection W and/or Wo Contrast Result Date: 08/13/2023 CLINICAL DATA:  Acute aortic syndrome (AAS) suspected. Chest pain radiating to left arm and left leg. Nausea. EXAM: CT ANGIOGRAPHY CHEST, ABDOMEN AND PELVIS TECHNIQUE: Non-contrast CT of the chest was initially obtained. Multidetector CT  imaging through the chest, abdomen and pelvis was performed using the standard protocol during bolus administration of intravenous contrast. Multiplanar reconstructed images and MIPs were obtained and reviewed to evaluate the vascular anatomy. RADIATION DOSE REDUCTION: This exam was performed according to the departmental dose-optimization program which includes automated exposure control, adjustment of the mA and/or kV according to patient size and/or use of iterative reconstruction technique. CONTRAST:  OMNIPAQUE IOHEXOL 350 MG/ML SOLN COMPARISON:  06/01/2023 FINDINGS: CTA CHEST FINDINGS Cardiovascular: Heart is normal size. Aorta is normal caliber. No evidence of dissection. No filling defects in the pulmonary arteries to suggest pulmonary emboli. Mediastinum/Nodes: No mediastinal, hilar, or axillary adenopathy. Trachea and esophagus are unremarkable. Thyroid unremarkable. Lungs/Pleura: Lungs are clear. No focal airspace opacities or suspicious nodules. No effusions. No pneumothorax. Musculoskeletal: Chest wall soft tissues are unremarkable. No acute bony abnormality. Review of the MIP images confirms the above findings. CTA ABDOMEN AND PELVIS FINDINGS VASCULAR Aorta: Normal caliber aorta without aneurysm, dissection, vasculitis or significant stenosis. Celiac: Patent without evidence of aneurysm, dissection, vasculitis or significant stenosis. SMA: Patent without evidence of aneurysm, dissection, vasculitis or significant stenosis. Renals: Both renal arteries are patent without evidence of aneurysm, dissection, vasculitis, fibromuscular dysplasia or significant stenosis. IMA: Patent without evidence of aneurysm, dissection, vasculitis or significant stenosis. Inflow: Patent without evidence of aneurysm, dissection, vasculitis or significant stenosis. Veins: No obvious venous abnormality within the limitations of this arterial phase study. Review of the MIP images confirms the above findings. NON-VASCULAR  Hepatobiliary: Diffuse low-density throughout the liver compatible with fatty infiltration. No focal abnormality. Gallbladder unremarkable. Pancreas: No focal abnormality or ductal dilatation. Spleen: No focal abnormality.  Normal size. Adrenals/Urinary Tract: No adrenal abnormality. No focal renal abnormality. No stones or hydronephrosis. Urinary bladder is unremarkable. Stomach/Bowel: Stomach, large and small bowel grossly unremarkable. Lymphatic: No adenopathy Reproductive: No visible focal abnormality. Other: No free fluid or free air. Musculoskeletal: No acute bony abnormality. Review of the MIP images confirms the above findings. IMPRESSION: No evidence of aortic aneurysm or dissection. No evidence of pulmonary embolus. No acute findings in the chest, abdomen or pelvis. Hepatic steatosis. Electronically Signed   By: Charlett Nose M.D.   On: 08/13/2023 01:27   DG Chest 2 View Result Date: 08/13/2023 CLINICAL DATA:  Chest pain EXAM: CHEST - 2 VIEW COMPARISON:  04/07/2020 FINDINGS: The heart size and mediastinal contours are within normal limits. Both lungs are clear. The visualized skeletal structures are unremarkable. IMPRESSION: No active cardiopulmonary disease. Electronically Signed   By: Charlett Nose M.D.   On: 08/13/2023 00:50    Labs:  CBC: Recent Labs    08/12/23 2353  WBC 14.0*  HGB 16.5  HCT 46.2  PLT 240    COAGS: No results for input(s): "INR", "APTT" in the last 8760 hours.  BMP: Recent Labs    08/12/23 2353  NA 134*  K 3.9  CL 100  CO2 23  GLUCOSE 100*  BUN 17  CALCIUM 9.1  CREATININE 1.12  GFRNONAA >60    LIVER FUNCTION TESTS: No results for input(s): "BILITOT", "AST", "ALT", "ALKPHOS", "PROT", "ALBUMIN" in the last 8760 hours.  TUMOR MARKERS: No results for input(s): "AFPTM", "CEA", "CA199", "CHROMGRNA" in the last 8760 hours.  Assessment and Plan: Headaches, left sided weakness and fevers  Patient is a 38 y/o male without significant medical history.  Patient presented to the ER with chest pain and left sided weakness. Subsequent work up revealed negative COVID, Flu and RSV.  EKG and chest xray were unremarkable. CT agio chest/abdomen/pelvis and CT angio head/neck negative. IR consulted to assist with a LP and coordinate this with anesthesia. Patient is scheduled for MRI of the brain and cervical spine at 8 am 08/14/23 and he will be brought to IR 2 by anesthesia for a fluoro guided LP.  Risks and benefits of lumbar puncture were discussed with the patient including bleeding, infection, damage to adjacent structures, leakage of spinal fluid and headache requiring blood patch.  All questions were answered, patient is agreeable to proceed. Consent signed and in chart.  Thank you for this interesting consult.  I greatly enjoyed meeting ABASS MISENER and look forward to participating in their care.  A copy of this report was sent to the requesting provider on this date.  Electronically Signed: Rosalita Levan, PA 08/13/2023, 4:16 PM   I spent a total of 20 Minutes    in face to face in clinical consultation, greater than 50% of which was counseling/coordinating care for

## 2023-08-13 NOTE — H&P (Signed)
 History and Physical    Patient: Ryan Griffith UJW:[id number] DOB: March 01, 1986 DOA: 08/12/2023 DOS: the patient was seen and examined on 08/13/2023 PCP: Patient, No Pcp Per  Patient coming from: Home  Medical readiness/disposition: Patient could potentially be ready as soon as 08/14/2023 pending neurological workup.  Patient will discharge back to home with wife.  Chief Complaint:  Chief Complaint  Patient presents with   Chest Pain   HPI: Ryan Griffith is a 38 y.o. male with no significant medical history who presented to the ED via EMS after reporting chest pain while driving.  He had symptoms radiating to the left arm and leg that began 1 hour prior to presentation.  This was associated with nausea and he was found to have a fever of 100.4 in triage.  It was noted that his initial blood pressure was 230/130 and decreased to 140/76 after he was given 1 sublingual nitroglycerin by EMS.  Since arrival to the ED his vital signs have remained stable.  He is continue to have a low-grade fever with most recent being 99.9.  He has developed some tachycardia with heart rates in the 120s.  O2 sats are normal.  Found to have leukocytosis with a white count of 14,000.  Differential was not obtained.  Other labs were otherwise unremarkable.  PCR for influenza, COVID and RSV was negative.  Urinalysis was unremarkable except for slightly elevated specific gravity of greater than 1.046.  Urine drug screen was positive for opiates but this was obtained after patient had received Dilaudid in the ED.  Because of his neurological symptoms at presentation was chest pain and EKG was obtained that was unremarkable.  Cardiac enzymes were negative.  Chest x-ray was negative.  CT angio of the chest abdomen and pelvis for dissection were negative.  CT head was unremarkable.  CT angio of the head and neck with and without contrast was negative.  Unfortunately symptoms have persisted with patient primarily  complaining of photophobia, severe headache.  Apparently the headache had been ongoing in his initial symptom for the past 2 to 3 weeks.  He was also having left shoulder pain.  The left lower extremity numbness had resolved.  He was able to grip with his left hand but it was much weaker than the right hand.  He had significant numbness of the left upper extremity from the shoulder to the fingertips noting unable to differentiate between soft or sharp sensation.  He was also unclear as to whether I was actually touching his upper extremity.  In regards to any risk factors he has not had any flulike symptoms or upper respiratory illnesses.  He has not had any tick or insect bites.  He has not had any recent or old trauma involving the neck or the shoulders.  He does not have a history of sleep apnea but apparently snores.  He does not use illegal drugs and only drinks social amounts of alcohol.  He vapes.  Hospitalist service has been asked to evaluate the patient for admission.  Please note that MRI of head and neck had been ordered by EDP but due to patient's significant claustrophobia were unable to be immediately obtained noting we are waiting on general anesthesia to be administered.  Review of Systems: As mentioned in the history of present illness. All other systems reviewed and are negative.   History reviewed. No pertinent past medical history. Past Surgical History:  Procedure Laterality Date   APPENDECTOMY  EYE SURGERY     FRACTURE SURGERY     hand   RECTAL SURGERY     Social History:  reports that he has quit smoking. He has never used smokeless tobacco. He reports that he does not drink alcohol. No history on file for drug use.  No Known Allergies  History reviewed. No pertinent family history.  Prior to Admission medications   Not on File    Physical Exam: Vitals:   08/13/23 0715 08/13/23 0745 08/13/23 0800 08/13/23 0900  BP: 108/71 119/69 116/76 115/76  Pulse: (!) 113  (!) 117 (!) 127 (!) 127  Resp:  19  20  Temp:      TempSrc:      SpO2: 97% 96% 97% 99%  Weight:      Height:       Constitutional: Uncomfortable secondary to ongoing severe headache and neck pain Eyes: PERRL, lids and conjunctivae normal Neck: normal, supple, tenderness reported with neck flexion and palpation over the cervical neck. (C2-3) Respiratory: clear to auscultation bilaterally, no wheezing, no crackles. Normal respiratory effort. No accessory muscle use. RA Cardiovascular: Regular rate and rhythm, no murmurs / rubs / gallops. No extremity edema. 2+ pedal pulses. No carotid bruits.  Abdomen: no tenderness, no masses palpated. No obvious hepatosplenomegaly. Bowel sounds positive.  Musculoskeletal: no clubbing / cyanosis. No joint deformity upper and lower extremities. Good ROM, no contractures. Normal muscle tone.  Skin: no rashes, lesions, ulcers. No induration Neurologic: CN 2-12 grossly intact. Sensation intact,  Strength 5/5 on the right side.  Left lower extremity unremarkable with strength and sensation 5/5.  Right upper extremity grip 1-2/5.  Patient reported on exam at unable to differentiate between soft and sharp touch.  At times it appeared that he was unable to determine if he was actually being touched. Psychiatric: Normal judgment and insight.  and oriented x 3.    Data Reviewed:  Sodium 134, potassium 3.9, chloride 100, CO2 23, BUN 17, glucose 100, creatinine 1.12, troponin 6 and 5, white count 14,000 with no differential obtained, hemoglobin 16.5, platelets 240,000  PCR for viral infections and urinalysis as above  Drug screen as above  Assessment and Plan: Severe headache, left upper extremity weakness/decreased sensation, fever and neck pain Differential includes discitis vs possible meningitis (although less likely) MR brain and cervical spine with and without contrast pending-to be done under general anesthesia due to patient's claustrophobia Given concerns  over possible infectious etiology causing neurological symptoms have initiated Rocephin 2 g IV every 12 hours Avoiding pharmacological DVT prophylaxis until clarified that patient will not need to undergo lumbar puncture IV Dilaudid and Tylenol for pain Neurochecks every 2 hours-admit to progressive level of care Neurology has been consulted  Atypical chest pain EKG and troponins unremarkable CTA of the chest has ruled out PE or aortic dissection noting patient presented with significantly elevated BP Suspect elevated BP secondary to severity of headache note has improved after administration of nitrates and has further improved with administration of medications for pain    Advance Care Planning:   Code Status: Full Code   VTE prophylaxis: Lovenox  Consults: Neurology  Family Communication: Wife and father at bedside  Severity of Illness: The appropriate patient status for this patient is OBSERVATION. Observation status is judged to be reasonable and necessary in order to provide the required intensity of service to ensure the patient's safety. The patient's presenting symptoms, physical exam findings, and initial radiographic and laboratory data in the context of  their medical condition is felt to place them at decreased risk for further clinical deterioration. Furthermore, it is anticipated that the patient will be medically stable for discharge from the hospital within 2 midnights of admission.   Author: Junious Silk, NP 08/13/2023 10:53 AM  For on call review [email or web address].

## 2023-08-13 NOTE — Progress Notes (Signed)
 Pharmacy Antibiotic Note  Ryan Griffith is a 38 y.o. male admitted on 08/12/2023 with meningitis.  Pharmacy has been consulted for vancomycin and acyclovir dosing.  Patient presented with sudden onset headache with 8 out of 10 pain. He has effort dependent left arm and left leg weakness and numbness and endorses significant photophobia. Due to concern for meningitis, he is undergoing a full workup including brain MRI and LP. He is also being initiated on broad spectrum antimicrobials.   Plan: Ceftriaxone per MD Acyclovir 10 mg/kg Q8h with 0.9% NaCl at 75 mL/hr Vancomycin 2500 mg IV x 1 dose followed by 1500 mg IV Q8h  Monitor renal function, cultures, and vancomycin levels as indicated  Height: 6\' 1"  (185.4 cm) Weight: (!) 145.2 kg (320 lb) IBW/kg (Calculated) : 79.9  Temp (24hrs), Avg:100.2 F (37.9 C), Min:99.7 F (37.6 C), Max:100.8 F (38.2 C)  Recent Labs  Lab 08/12/23 2353  WBC 14.0*  CREATININE 1.12    Estimated Creatinine Clearance: 136.7 mL/min (by C-G formula based on SCr of 1.12 mg/dL).    No Known Allergies  Antimicrobials this admission: Ceftriaxone 3/19 >>  Vancomycin 3/19 >>  Acyclovir 3/19 >>>  Dose adjustments this admission: N/A  Microbiology results: N/A  Thank you for allowing pharmacy to be a part of this patient's care.  Lennie Muckle, PharmD PGY1 Pharmacy Resident 08/13/2023 7:00 PM

## 2023-08-14 ENCOUNTER — Observation Stay (HOSPITAL_COMMUNITY): Payer: Self-pay

## 2023-08-14 ENCOUNTER — Encounter (HOSPITAL_COMMUNITY)
Admission: EM | Disposition: A | Discharge status: Home / Self Care | Payer: Self-pay | Source: Home / Self Care | Attending: Nurse Practitioner

## 2023-08-14 ENCOUNTER — Encounter (HOSPITAL_COMMUNITY): Payer: Self-pay | Admitting: Internal Medicine

## 2023-08-14 ENCOUNTER — Other Ambulatory Visit (HOSPITAL_COMMUNITY): Payer: Self-pay

## 2023-08-14 DIAGNOSIS — R531 Weakness: Secondary | ICD-10-CM | POA: Diagnosis not present

## 2023-08-14 DIAGNOSIS — H53149 Visual discomfort, unspecified: Secondary | ICD-10-CM | POA: Diagnosis not present

## 2023-08-14 DIAGNOSIS — R200 Anesthesia of skin: Secondary | ICD-10-CM | POA: Diagnosis present

## 2023-08-14 DIAGNOSIS — Z1152 Encounter for screening for COVID-19: Secondary | ICD-10-CM | POA: Diagnosis not present

## 2023-08-14 DIAGNOSIS — R110 Nausea: Secondary | ICD-10-CM | POA: Diagnosis present

## 2023-08-14 DIAGNOSIS — R0789 Other chest pain: Secondary | ICD-10-CM | POA: Diagnosis present

## 2023-08-14 DIAGNOSIS — Z8719 Personal history of other diseases of the digestive system: Secondary | ICD-10-CM | POA: Diagnosis not present

## 2023-08-14 DIAGNOSIS — Z9049 Acquired absence of other specified parts of digestive tract: Secondary | ICD-10-CM | POA: Diagnosis not present

## 2023-08-14 DIAGNOSIS — R509 Fever, unspecified: Secondary | ICD-10-CM | POA: Diagnosis present

## 2023-08-14 DIAGNOSIS — R29818 Other symptoms and signs involving the nervous system: Secondary | ICD-10-CM | POA: Diagnosis present

## 2023-08-14 DIAGNOSIS — F40240 Claustrophobia: Secondary | ICD-10-CM | POA: Diagnosis present

## 2023-08-14 DIAGNOSIS — Z6841 Body Mass Index (BMI) 40.0 and over, adult: Secondary | ICD-10-CM | POA: Diagnosis not present

## 2023-08-14 DIAGNOSIS — R000 Tachycardia, unspecified: Secondary | ICD-10-CM | POA: Diagnosis present

## 2023-08-14 DIAGNOSIS — R519 Headache, unspecified: Secondary | ICD-10-CM | POA: Diagnosis present

## 2023-08-14 DIAGNOSIS — F17290 Nicotine dependence, other tobacco product, uncomplicated: Secondary | ICD-10-CM | POA: Diagnosis present

## 2023-08-14 DIAGNOSIS — E871 Hypo-osmolality and hyponatremia: Secondary | ICD-10-CM | POA: Diagnosis present

## 2023-08-14 DIAGNOSIS — R0683 Snoring: Secondary | ICD-10-CM | POA: Diagnosis present

## 2023-08-14 DIAGNOSIS — M25512 Pain in left shoulder: Secondary | ICD-10-CM | POA: Diagnosis present

## 2023-08-14 DIAGNOSIS — D72829 Elevated white blood cell count, unspecified: Secondary | ICD-10-CM | POA: Diagnosis present

## 2023-08-14 DIAGNOSIS — R030 Elevated blood-pressure reading, without diagnosis of hypertension: Secondary | ICD-10-CM | POA: Diagnosis present

## 2023-08-14 DIAGNOSIS — E66813 Obesity, class 3: Secondary | ICD-10-CM | POA: Diagnosis present

## 2023-08-14 HISTORY — PX: RADIOLOGY WITH ANESTHESIA: SHX6223

## 2023-08-14 HISTORY — PX: IR LUMBAR PUNCTURE: IMG944

## 2023-08-14 LAB — COMPREHENSIVE METABOLIC PANEL
ALT: 39 U/L (ref 0–44)
AST: 21 U/L (ref 15–41)
Albumin: 3.2 g/dL — ABNORMAL LOW (ref 3.5–5.0)
Alkaline Phosphatase: 38 U/L (ref 38–126)
Anion gap: 8 (ref 5–15)
BUN: 8 mg/dL (ref 6–20)
CO2: 27 mmol/L (ref 22–32)
Calcium: 8.1 mg/dL — ABNORMAL LOW (ref 8.9–10.3)
Chloride: 98 mmol/L (ref 98–111)
Creatinine, Ser: 0.99 mg/dL (ref 0.61–1.24)
GFR, Estimated: >60 mL/min (ref >60)
Glucose, Bld: 100 mg/dL — ABNORMAL HIGH (ref 70–99)
Potassium: 3.7 mmol/L (ref 3.5–5.1)
Sodium: 133 mmol/L — ABNORMAL LOW (ref 135–145)
Total Bilirubin: 0.9 mg/dL (ref 0.0–1.2)
Total Protein: 6.1 g/dL — ABNORMAL LOW (ref 6.5–8.1)

## 2023-08-14 LAB — MENINGITIS/ENCEPHALITIS PANEL (CSF)

## 2023-08-14 LAB — CBC
HCT: 42.6 % (ref 39.0–52.0)
Hemoglobin: 14.7 g/dL (ref 13.0–17.0)
MCH: 30.2 pg (ref 26.0–34.0)
MCHC: 34.5 g/dL (ref 30.0–36.0)
MCV: 87.5 fL (ref 80.0–100.0)
Platelets: 171 10*3/uL (ref 150–400)
RBC: 4.87 MIL/uL (ref 4.22–5.81)
RDW: 13.2 % (ref 11.5–15.5)
WBC: 11.4 10*3/uL — ABNORMAL HIGH (ref 4.0–10.5)
nRBC: 0 % (ref 0.0–0.2)

## 2023-08-14 LAB — CSF CELL COUNT WITH DIFFERENTIAL
RBC Count, CSF: 5 /mm3 — ABNORMAL HIGH
Tube #: 1
WBC, CSF: 2 /mm3 (ref 0–5)

## 2023-08-14 LAB — GLUCOSE, CSF: Glucose, CSF: 67 mg/dL (ref 40–70)

## 2023-08-14 LAB — PROTEIN, CSF: Total  Protein, CSF: 52 mg/dL — ABNORMAL HIGH (ref 15–45)

## 2023-08-14 SURGERY — MRI WITH ANESTHESIA
Anesthesia: General

## 2023-08-14 MED ORDER — SODIUM CHLORIDE (PF) 0.9 % IJ SOLN
INTRAMUSCULAR | Status: AC
  Filled 2023-08-14: qty 10

## 2023-08-14 MED ORDER — GADOBUTROL 1 MMOL/ML IV SOLN
10.0000 mL | Freq: Once | INTRAVENOUS | Status: AC | PRN
  Administered 2023-08-14: 10 mL via INTRAVENOUS

## 2023-08-14 MED ORDER — DEXMEDETOMIDINE HCL IN NACL 80 MCG/20ML IV SOLN
INTRAVENOUS | Status: DC | PRN
  Administered 2023-08-14: 12 ug via INTRAVENOUS

## 2023-08-14 MED ORDER — POLYETHYLENE GLYCOL 3350 17 G PO PACK: 17.0000 g | PACK | Freq: Every day | ORAL | Status: DC | PRN

## 2023-08-14 MED ORDER — AMISULPRIDE (ANTIEMETIC) 5 MG/2ML IV SOLN
INTRAVENOUS | Status: AC
Start: 2023-08-14 — End: 2023-08-14
  Filled 2023-08-14: qty 4

## 2023-08-14 MED ORDER — PHENYLEPHRINE HCL-NACL 20-0.9 MG/250ML-% IV SOLN
INTRAVENOUS | Status: DC | PRN
  Administered 2023-08-14: 25 ug/min via INTRAVENOUS

## 2023-08-14 MED ORDER — SUGAMMADEX SODIUM 200 MG/2ML IV SOLN
INTRAVENOUS | Status: DC | PRN
  Administered 2023-08-14: 300 mg via INTRAVENOUS
  Administered 2023-08-14: 100 mg via INTRAVENOUS

## 2023-08-14 MED ORDER — CHLORHEXIDINE GLUCONATE 0.12 % MT SOLN
OROMUCOSAL | Status: AC
  Filled 2023-08-14: qty 15

## 2023-08-14 MED ORDER — IOHEXOL 180 MG/ML  SOLN
INTRAMUSCULAR | Status: AC
  Filled 2023-08-14: qty 10

## 2023-08-14 MED ORDER — FENTANYL CITRATE (PF) 100 MCG/2ML IJ SOLN: 25.0000 ug | INTRAMUSCULAR | Status: DC | PRN

## 2023-08-14 MED ORDER — LIDOCAINE 2% (20 MG/ML) 5 ML SYRINGE
INTRAMUSCULAR | Status: DC | PRN
  Administered 2023-08-14: 100 mg via INTRAVENOUS

## 2023-08-14 MED ORDER — ONDANSETRON HCL 4 MG/2ML IJ SOLN: 4.0000 mg | Freq: Once | INTRAMUSCULAR | Status: DC | PRN

## 2023-08-14 MED ORDER — PHENYLEPHRINE 80 MCG/ML (10ML) SYRINGE FOR IV PUSH (FOR BLOOD PRESSURE SUPPORT)
PREFILLED_SYRINGE | INTRAVENOUS | Status: DC | PRN
  Administered 2023-08-14 (×2): 160 ug via INTRAVENOUS

## 2023-08-14 MED ORDER — VALPROATE SODIUM 100 MG/ML IV SOLN
500.0000 mg | Freq: Once | INTRAVENOUS | Status: AC
  Administered 2023-08-14: 500 mg via INTRAVENOUS
  Filled 2023-08-14: qty 5

## 2023-08-14 MED ORDER — LIDOCAINE HCL (PF) 1 % IJ SOLN
INTRAMUSCULAR | Status: AC
  Filled 2023-08-14: qty 30

## 2023-08-14 MED ORDER — SENNOSIDES-DOCUSATE SODIUM 8.6-50 MG PO TABS
1.0000 | ORAL_TABLET | Freq: Two times a day (BID) | ORAL | Status: DC
  Administered 2023-08-14 – 2023-08-15 (×3): 1 via ORAL
  Filled 2023-08-14 (×3): qty 1

## 2023-08-14 MED ORDER — SODIUM CHLORIDE 0.9 % IV BOLUS
1000.0000 mL | Freq: Once | INTRAVENOUS | Status: AC
  Administered 2023-08-14: 1000 mL via INTRAVENOUS

## 2023-08-14 MED ORDER — IBUPROFEN 400 MG PO TABS: 400.0000 mg | ORAL_TABLET | Freq: Four times a day (QID) | ORAL | 0 refills | Status: AC | PRN

## 2023-08-14 MED ORDER — AMISULPRIDE (ANTIEMETIC) 5 MG/2ML IV SOLN
10.0000 mg | Freq: Once | INTRAVENOUS | Status: AC | PRN
  Administered 2023-08-14: 10 mg via INTRAVENOUS

## 2023-08-14 MED ORDER — PROCHLORPERAZINE EDISYLATE 10 MG/2ML IJ SOLN
10.0000 mg | Freq: Once | INTRAMUSCULAR | Status: AC
  Administered 2023-08-14: 10 mg via INTRAVENOUS
  Filled 2023-08-14: qty 2

## 2023-08-14 MED ORDER — DIPHENHYDRAMINE HCL 50 MG/ML IJ SOLN
25.0000 mg | Freq: Once | INTRAMUSCULAR | Status: AC
  Administered 2023-08-14: 25 mg via INTRAVENOUS
  Filled 2023-08-14: qty 1

## 2023-08-14 MED ORDER — POLYETHYLENE GLYCOL 3350 17 G PO PACK: 17.0000 g | PACK | Freq: Every day | ORAL | Status: AC | PRN

## 2023-08-14 MED ORDER — ROCURONIUM BROMIDE 10 MG/ML (PF) SYRINGE
PREFILLED_SYRINGE | INTRAVENOUS | Status: DC | PRN
  Administered 2023-08-14 (×2): 20 mg via INTRAVENOUS
  Administered 2023-08-14: 80 mg via INTRAVENOUS
  Administered 2023-08-14: 50 mg via INTRAVENOUS

## 2023-08-14 MED ORDER — PROPOFOL 10 MG/ML IV BOLUS
INTRAVENOUS | Status: DC | PRN
  Administered 2023-08-14: 150 mg via INTRAVENOUS
  Administered 2023-08-14: 20 mg via INTRAVENOUS
  Administered 2023-08-14: 50 mg via INTRAVENOUS
  Administered 2023-08-14 (×3): 20 mg via INTRAVENOUS

## 2023-08-14 MED ORDER — MAGNESIUM SULFATE 2 GM/50ML IV SOLN
2.0000 g | Freq: Once | INTRAVENOUS | Status: AC
  Administered 2023-08-14: 2 g via INTRAVENOUS
  Filled 2023-08-14: qty 50

## 2023-08-14 NOTE — Progress Notes (Signed)
 Patient arrived on unit and placed on telemetry box at 2235. Belongings in bag at bedside. Phone next to patient.

## 2023-08-14 NOTE — Progress Notes (Signed)
 NEUROLOGY CONSULT FOLLOW UP NOTE   Date of service: August 14, 2023 Patient Name: Ryan Griffith MRN:  [id number] DOB:  [birth year]  Interval Hx/subjective   Continues to report a headache.  No fever since yesterday afternoon.  He had MRI of the brain and cervical spine with and without contrast which was negative for any acute intracranial modalities. The cervical spine did not show any cord compression, spinal canal stenosis, transverse myelitis or significant foraminal stenosis.  Vitals   Vitals:   08/14/23 1119 08/14/23 1130 08/14/23 1145 08/14/23 1216  BP: 117/87 119/84 125/78 121/70  Pulse: (!) 103 (!) 102 98 99  Resp: 13 16 15    Temp: 98.3 F (36.8 C)  98.4 F (36.9 C) 98.4 F (36.9 C)  TempSrc:    Oral  SpO2: 98% 99% 95% 94%  Weight:      Height:         Body mass index is 41.09 kg/m.  Physical Exam   General: Laying comfortably in bed; in no acute distress.  HENT: Normal oropharynx and mucosa. Normal external appearance of ears and nose.  Neck: Supple, no pain or tenderness  CV: No JVD. No peripheral edema.  Pulmonary: Symmetric Chest rise. Normal respiratory effort.  Abdomen: Soft to touch, non-tender.  Ext: No cyanosis, edema, or deformity  Skin: No rash. Normal palpation of skin.   Musculoskeletal: Normal digits and nails by inspection. No clubbing.   Neurologic Examination  Mental status/Cognition: Alert, oriented to self, place, month and year, good attention.  Speech/language: Fluent, comprehension intact, object naming intact, repetition intact.  Cranial nerves:   CN II Pupils equal and reactive to light, no VF deficits    CN III,IV,VI EOM intact, no gaze preference or deviation, no nystagmus    CN V normal sensation in V1, V2, and V3 segments bilaterally    CN VII no asymmetry, no nasolabial fold flattening    CN VIII normal hearing to speech    CN IX & X normal palatal elevation, no uvular deviation    CN XI 5/5 head turn and 5/5 shoulder shrug  bilaterally    CN XII midline tongue protrusion    Motor:  Muscle bulk: Normal, tone normal Mvmt Root Nerve  Muscle Right Left Comments  SA C5/6 Ax Deltoid 5 4+   EF C5/6 Mc Biceps 5 4+   EE C6/7/8 Rad Triceps 5 4+   WF C6/7 Med FCR     WE C7/8 PIN ECU     F Ab C8/T1 U ADM/FDI 5 4+   HF L1/2/3 Fem Illopsoas 5 4+   KE L2/3/4 Fem Quad 5 4+   DF L4/5 D Peron Tib Ant 5 5   PF S1/2 Tibial Grc/Sol 5 5    Sensation:  Light touch Intact throughout today.   Pin prick    Temperature    Vibration   Proprioception    Coordination/Complex Motor:  - Finger to Nose intact bilaterally - Heel to shin intact bilaterally - Rapid alternating movement are normal - Gait: Deferred.  Medications  Current Facility-Administered Medications:    0.9 %  sodium chloride infusion, , Intravenous, Continuous, Alekh, Kshitiz, MD, Last Rate: 0 mL/hr at 08/14/23 0440, New Bag at 08/14/23 0746   acetaminophen (TYLENOL) tablet 650 mg, 650 mg, Oral, Q6H PRN, Hanley Ben, Kshitiz, MD, 650 mg at 08/13/23 2104   amisulpride (BARHEMSYS) 5 MG/2ML injection, , , ,    chlorhexidine (PERIDEX) 0.12 % solution, , , ,  guaiFENesin-dextromethorphan (ROBITUSSIN DM) 100-10 MG/5ML syrup 10 mL, 10 mL, Oral, Q4H PRN, Hanley Ben, Kshitiz, MD, 10 mL at 08/14/23 0447   HYDROmorphone (DILAUDID) injection 1 mg, 1 mg, Intravenous, Q2H PRN, Russella Dar, NP, 1 mg at 08/14/23 1334   ondansetron (ZOFRAN) tablet 4 mg, 4 mg, Oral, Q6H PRN **OR** ondansetron (ZOFRAN) injection 4 mg, 4 mg, Intravenous, Q6H PRN, Russella Dar, NP   polyethylene glycol (MIRALAX / GLYCOLAX) packet 17 g, 17 g, Oral, Daily PRN, Hanley Ben, Kshitiz, MD   senna-docusate (Senokot-S) tablet 1 tablet, 1 tablet, Oral, BID, Alekh, Kshitiz, MD, 1 tablet at 08/14/23 1333   sodium chloride flush (NS) 0.9 % injection 3 mL, 3 mL, Intravenous, Q12H, Russella Dar, NP  Labs and Diagnostic Imaging   CBC:  Recent Labs  Lab 08/12/23 2353 08/14/23 0633  WBC 14.0* 11.4*  HGB  16.5 14.7  HCT 46.2 42.6  MCV 86.7 87.5  PLT 240 171    Basic Metabolic Panel:  Lab Results  Component Value Date   NA 133 (L) 08/14/2023   K 3.7 08/14/2023   CO2 27 08/14/2023   GLUCOSE 100 (H) 08/14/2023   BUN 8 08/14/2023   CREATININE 0.99 08/14/2023   CALCIUM 8.1 (L) 08/14/2023   GFRNONAA >60 08/14/2023   GFRAA >60 01/10/2020   Lipid Panel: No results found for: "LDLCALC" HgbA1c: No results found for: "HGBA1C" Urine Drug Screen:     Component Value Date/Time   LABOPIA POSITIVE (A) 08/13/2023 0755   COCAINSCRNUR NONE DETECTED 08/13/2023 0755   LABBENZ NONE DETECTED 08/13/2023 0755   AMPHETMU NONE DETECTED 08/13/2023 0755   THCU NONE DETECTED 08/13/2023 0755   LABBARB NONE DETECTED 08/13/2023 0755    Alcohol Level No results found for: "ETH" INR No results found for: "INR" APTT No results found for: "APTT" AED levels: No results found for: "PHENYTOIN", "ZONISAMIDE", "LAMOTRIGINE", "LEVETIRACETA"  MRI Brain with and without contrast (Personally reviewed): 1. No acute intracranial abnormality. 2. Minimal cerebral white matter T2 signal changes, nonspecific though may reflect early chronic small vessel ischemia, migraines, or prior infection/inflammation. No specific findings of  MRI of the cervical spine with and without contrast [personally reviewed]: Negative cervical spine MRI.  CSF studies: WBC count of 0, CSF protein of 52 is nonspecific, CSF glucose of 67 is normal, meningitis/encephalitis bio fire panel is negative, CSF cultures and Gram stain with no organisms seen.  CSF culture is still pending.  Assessment   Ryan Griffith is a 38 y.o. male with remote hx of appendicitis status post appendectomy who presents with sudden onset 8 out of 10 bifrontal headache that he describes as stabbing pain maximal at onset along with stabbing chest pain and left arm weakness and numbness.   Neuroexam notable for effort dependent left arm and left leg weakness and  numbness. He endorses significant photophobia. No prior similar headache. No hx of MS, no hx of dissection.  Extensive workup so far with CT chest abdomen pelvis negative for aortic dissection, CT head negative for ICH or subarachnoid hemorrhage, MRI brain negative for any hemorrhage and LP with no xanthochromia and therefore subarachnoid hemorrhage is unlikely.  CT angio with no vasospasm concerning for RCVS.  MRI of the brain and cervical spine also did not show venous sinus thrombosis, negative for any demyelinating lesion.  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  The pattern of his weakness is most consistent with giveaway weakness. Recommendations  -Stop empiric meningitis  coverage. -PT and OT.  May need rehab for functional neurological symptom disorder depending on how he does with PT and OT. -I would avoid using opioids for headache.  I have ordered a headache cocktail with Benadryl, Compazine, IV fluids, magnesium and valproic acid. -Recommend outpatient follow-up with neurology. -Neurology inpatient team will sign off please feel free to contact us with any questions or concerns.  ______________________________________________________________________   Welton Flakes, MD [facility]

## 2023-08-14 NOTE — TOC Transition Note (Signed)
 Transition of Care [facility]) - Discharge Note   Patient Details  Name: Ryan Griffith MRN: [id number] Date of Birth: May 24, 1986  Transition of Care [facility] CM/SW Contact:  Kermit Balo, RN Phone Number: 08/14/2023, 1:47 PM   Clinical Narrative:     Pt is discharging home with self care.  Pt with no insurance and no PCP. CM added information on the [facility] to his AVS. Pt will have to complete admission packet with [facility] and then they will schedule an appointment.  Pt has transportation home.   Final next level of care: Home/Self Care Barriers to Discharge: Inadequate or no insurance, Barriers Unresolved (comment)   Patient Goals and CMS Choice            Discharge Placement                       Discharge Plan and Services Additional resources added to the After Visit Summary for                                       Social Drivers of Health (SDOH) Interventions SDOH Screenings   Food Insecurity: No Food Insecurity (08/13/2023)  Housing: Low Risk  (08/13/2023)  Transportation Needs: No Transportation Needs (08/13/2023)  Utilities: Not At Risk (08/13/2023)  Social Connections: Unknown (10/08/2021)   Received from [facility]  Tobacco Use: Medium Risk (08/14/2023)     Readmission Risk Interventions     No data to display

## 2023-08-14 NOTE — Anesthesia Procedure Notes (Signed)
 Procedure Name: Intubation Date/Time: 08/14/2023 8:01 AM  Performed by: Alwyn Ren, CRNAPre-anesthesia Checklist: Patient identified, Emergency Drugs available, Suction available and Patient being monitored Patient Re-evaluated:Patient Re-evaluated prior to induction Oxygen Delivery Method: Circle system utilized Preoxygenation: Pre-oxygenation with 100% oxygen Induction Type: IV induction Ventilation: Mask ventilation without difficulty Laryngoscope Size: Glidescope and 4 Grade View: Grade II Tube type: Oral Tube size: 7.5 mm Number of attempts: 1 Airway Equipment and Method: Stylet and Oral airway Placement Confirmation: ETT inserted through vocal cords under direct vision, positive ETCO2 and breath sounds checked- equal and bilateral Secured at: 24 cm Tube secured with: Tape Dental Injury: Teeth and Oropharynx as per pre-operative assessment

## 2023-08-14 NOTE — Progress Notes (Signed)
 PROGRESS NOTE    Ryan Griffith  ZOX:[id number] DOB: Oct 05, 1985 DOA: 08/12/2023 PCP: Patient, No Pcp Per   Brief Narrative:  38 year old male with no past medical history presented with chest pain and severe headache with left upper extremity weakness/numbness with nausea and was found to be febrile on presentation with extremely elevated blood pressure.  On presentation, CT angio head and neck and CT head without contrast were negative for acute intracranial abnormality and LVO. CT angio chest/abdomen/pelvis was negative for aortic aneurysm/dissection or pulmonary embolus.  Neurology recommended MRI of brain and cervical spine along with LP.  Patient started on broad-spectrum antibiotics and antivirals.  Assessment & Plan:   Severe headache with left upper extremity weakness/numbness along with fever -Imaging as above.  Rule out meningitis/discitis. -Neurology following: recommended MRI of brain and cervical spine along with LP.  Because of claustrophobia, MRI and LP to be attempted today under anesthesia. -Continue broad-spectrum antibiotics and antivirals as per neurology recommendations. -Temperature max of 100.8 over the last 24 hours  Atypical chest pain -EKG and troponins unremarkable.  CTA chest negative for PE/aortic dissection/embolism -Improved.  Leukocytosis -Pending today.  Monitor  Hyponatremia -Mild.  Monitor intermittently.  Obesity class III -Outpatient follow-up   DVT prophylaxis: Lovenox to be held today for possible LP Code Status: Full Family Communication: Father at bedside  Disposition Plan: Status is: Observation The patient will require care spanning > 2 midnights and should be moved to inpatient because: Of severity of illness    Consultants: Neurology/IR  Procedures: None  Antimicrobials:  Anti-infectives (From admission, onward)    Start     Dose/Rate Route Frequency Ordered Stop   08/14/23 0400  vancomycin (VANCOREADY) IVPB 1500 mg/300  mL        1,500 mg 150 mL/hr over 120 Minutes Intravenous Every 8 hours 08/13/23 1901     08/13/23 1915  acyclovir (ZOVIRAX) 1,060 mg in dextrose 5 % 250 mL IVPB        10 mg/kg  106 kg (Adjusted) 271.2 mL/hr over 60 Minutes Intravenous Every 8 hours 08/13/23 1901     08/13/23 1915  vancomycin (VANCOCIN) 2,500 mg in sodium chloride 0.9 % 500 mL IVPB        2,500 mg 262.5 mL/hr over 120 Minutes Intravenous  Once 08/13/23 1901 08/13/23 2313   08/13/23 0930  cefTRIAXone (ROCEPHIN) 2 g in sodium chloride 0.9 % 100 mL IVPB        2 g 200 mL/hr over 30 Minutes Intravenous 2 times daily 08/13/23 0914          Subjective: Patient seen and examined at bedside.  Continues to have intermittent headache.  No chest pain, vomiting reported.  Had fever yesterday. Objective: Vitals:   08/13/23 2145 08/13/23 2235 08/13/23 2356 08/14/23 0424  BP: 131/89 107/62 122/81 117/75  Pulse:  (!) 105 100 89  Resp: 17 18    Temp:  98.6 F (37 C) 98.2 F (36.8 C) 98.1 F (36.7 C)  TempSrc:  Oral Oral Oral  SpO2:  93% 91% 98%  Weight:  (!) 144.9 kg    Height:  6\' 2"  (1.88 m)      Intake/Output Summary (Last 24 hours) at 08/14/2023 0643 Last data filed at 08/14/2023 0502 Gross per 24 hour  Intake 1896.49 ml  Output --  Net 1896.49 ml   Filed Weights   08/12/23 2342 08/13/23 2235  Weight: (!) 145.2 kg (!) 144.9 kg    Examination:  General exam: Appears  calm and comfortable  Respiratory system: Bilateral decreased breath sounds at bases Cardiovascular system: S1 & S2 heard, Rate controlled Gastrointestinal system: Abdomen is morbidly obese, nondistended, soft and nontender. Normal bowel sounds heard. Extremities: No cyanosis, clubbing, edema  Central nervous system: Slow to respond.  Mild left upper extremity weakness present.   Skin: No rashes, lesions or ulcers Psychiatry: Flat affect.  Not agitated.    Data Reviewed: I have personally reviewed following labs and imaging  studies  CBC: Recent Labs  Lab 08/12/23 2353  WBC 14.0*  HGB 16.5  HCT 46.2  MCV 86.7  PLT 240   Basic Metabolic Panel: Recent Labs  Lab 08/12/23 2353  NA 134*  K 3.9  CL 100  CO2 23  GLUCOSE 100*  BUN 17  CREATININE 1.12  CALCIUM 9.1   GFR: Estimated Creatinine Clearance: 138.4 mL/min (by C-G formula based on SCr of 1.12 mg/dL). Liver Function Tests: No results for input(s): "AST", "ALT", "ALKPHOS", "BILITOT", "PROT", "ALBUMIN" in the last 168 hours. No results for input(s): "LIPASE", "AMYLASE" in the last 168 hours. No results for input(s): "AMMONIA" in the last 168 hours. Coagulation Profile: No results for input(s): "INR", "PROTIME" in the last 168 hours. Cardiac Enzymes: No results for input(s): "CKTOTAL", "CKMB", "CKMBINDEX", "TROPONINI" in the last 168 hours. BNP (last 3 results) No results for input(s): "PROBNP" in the last 8760 hours. HbA1C: No results for input(s): "HGBA1C" in the last 72 hours. CBG: Recent Labs  Lab 08/13/23 2243  GLUCAP 102*   Lipid Profile: No results for input(s): "CHOL", "HDL", "LDLCALC", "TRIG", "CHOLHDL", "LDLDIRECT" in the last 72 hours. Thyroid Function Tests: No results for input(s): "TSH", "T4TOTAL", "FREET4", "T3FREE", "THYROIDAB" in the last 72 hours. Anemia Panel: No results for input(s): "VITAMINB12", "FOLATE", "FERRITIN", "TIBC", "IRON", "RETICCTPCT" in the last 72 hours. Sepsis Labs: No results for input(s): "PROCALCITON", "LATICACIDVEN" in the last 168 hours.  Recent Results (from the past 240 hours)  Resp panel by RT-PCR (RSV, Flu A&B, Covid) Anterior Nasal Swab     Status: None   Collection Time: 08/12/23 11:52 PM   Specimen: Anterior Nasal Swab  Result Value Ref Range Status   SARS Coronavirus 2 by RT PCR NEGATIVE NEGATIVE Final   Influenza A by PCR NEGATIVE NEGATIVE Final   Influenza B by PCR NEGATIVE NEGATIVE Final    Comment: (NOTE) The Xpert Xpress SARS-CoV-2/FLU/RSV plus assay is intended as an aid in  the diagnosis of influenza from Nasopharyngeal swab specimens and should not be used as a sole basis for treatment. Nasal washings and aspirates are unacceptable for Xpert Xpress SARS-CoV-2/FLU/RSV testing.  Fact Sheet for Patients: BloggerCourse.com  Fact Sheet for Healthcare Providers: SeriousBroker.it  This test is not yet approved or cleared by the [place] FDA and has been authorized for detection and/or diagnosis of SARS-CoV-2 by FDA under an Emergency Use Authorization (EUA). This EUA will remain in effect (meaning this test can be used) for the duration of the COVID-19 declaration under Section 564(b)(1) of the Act, 21 U.S.C. section 360bbb-3(b)(1), unless the authorization is terminated or revoked.     Resp Syncytial Virus by PCR NEGATIVE NEGATIVE Final    Comment: (NOTE) Fact Sheet for Patients: BloggerCourse.com  Fact Sheet for Healthcare Providers: SeriousBroker.it  This test is not yet approved or cleared by the [place] FDA and has been authorized for detection and/or diagnosis of SARS-CoV-2 by FDA under an Emergency Use Authorization (EUA). This EUA will remain in effect (meaning this test can be  used) for the duration of the COVID-19 declaration under Section 564(b)(1) of the Act, 21 U.S.C. section 360bbb-3(b)(1), unless the authorization is terminated or revoked.  Performed at [facility], [address] [place], [place] [address]          Radiology Studies: CT ANGIO HEAD NECK W WO CM Result Date: 08/13/2023 CLINICAL DATA:  Initial evaluation for acute neuro deficit, stroke suspected. EXAM: CT ANGIOGRAPHY HEAD AND NECK WITH AND WITHOUT CONTRAST TECHNIQUE: Multidetector CT imaging of the head and neck was performed using the standard protocol during bolus administration of intravenous contrast. Multiplanar CT image reconstructions and MIPs  were obtained to evaluate the vascular anatomy. Carotid stenosis measurements (when applicable) are obtained utilizing NASCET criteria, using the distal internal carotid diameter as the denominator. RADIATION DOSE REDUCTION: This exam was performed according to the departmental dose-optimization program which includes automated exposure control, adjustment of the mA and/or kV according to patient size and/or use of iterative reconstruction technique. CONTRAST:  OMNIPAQUE IOHEXOL 350 MG/ML SOLN COMPARISON:  Comparison made with head CT performed on the same day. FINDINGS: CTA NECK FINDINGS Aortic arch: Standard branching. Imaged portion shows no evidence of aneurysm or dissection. No significant stenosis of the major arch vessel origins. Right carotid system: No evidence of dissection, stenosis (50% or greater), or occlusion. Left carotid system: No evidence of dissection, stenosis (50% or greater), or occlusion. Vertebral arteries: No evidence of dissection, stenosis (50% or greater), or occlusion. Skeleton: No discrete or worrisome osseous lesions. Other neck: No other acute finding. Upper chest: No other acute finding. Review of the MIP images confirms the above findings CTA HEAD FINDINGS Anterior circulation: Both internal carotid arteries widely patent to the termini without stenosis. A1 segments widely patent. Normal anterior communicating artery complex. Both anterior cerebral arteries widely patent to their distal aspects without stenosis. No M1 stenosis or occlusion. Normal MCA bifurcations. Distal MCA branches well perfused and symmetric. Posterior circulation: Both V4 segments patent to the vertebrobasilar junction without stenosis. Both PICA origins patent and normal. Basilar widely patent to its distal aspect without stenosis. Superior cerebellar arteries patent bilaterally. Both PCAs primarily supplied via the basilar and are well perfused to there distal aspects. Venous sinuses: Patent allowing  for timing the contrast bolus. Anatomic variants: None significant.  No aneurysm. Review of the MIP images confirms the above findings IMPRESSION: Normal CTA of the head and neck. No large vessel occlusion, hemodynamically significant stenosis, or other acute vascular abnormality. Electronically Signed   By: Rise Mu M.D.   On: 08/13/2023 02:10   CT Head Wo Contrast Result Date: 08/13/2023 CLINICAL DATA:  Headache EXAM: CT HEAD WITHOUT CONTRAST TECHNIQUE: Contiguous axial images were obtained from the base of the skull through the vertex without intravenous contrast. RADIATION DOSE REDUCTION: This exam was performed according to the departmental dose-optimization program which includes automated exposure control, adjustment of the mA and/or kV according to patient size and/or use of iterative reconstruction technique. COMPARISON:  None Available. FINDINGS: Brain: No acute intracranial abnormality. Specifically, no hemorrhage, hydrocephalus, mass lesion, acute infarction, or significant intracranial injury. Vascular: No hyperdense vessel or unexpected calcification. Skull: No acute calvarial abnormality. Sinuses/Orbits: No acute findings Other: None IMPRESSION: No acute intracranial abnormality. Electronically Signed   By: Charlett Nose M.D.   On: 08/13/2023 01:32   CT Angio Chest/Abd/Pel for Dissection W and/or Wo Contrast Result Date: 08/13/2023 CLINICAL DATA:  Acute aortic syndrome (AAS) suspected. Chest pain radiating to left arm and left leg.  Nausea. EXAM: CT ANGIOGRAPHY CHEST, ABDOMEN AND PELVIS TECHNIQUE: Non-contrast CT of the chest was initially obtained. Multidetector CT imaging through the chest, abdomen and pelvis was performed using the standard protocol during bolus administration of intravenous contrast. Multiplanar reconstructed images and MIPs were obtained and reviewed to evaluate the vascular anatomy. RADIATION DOSE REDUCTION: This exam was performed according to the departmental  dose-optimization program which includes automated exposure control, adjustment of the mA and/or kV according to patient size and/or use of iterative reconstruction technique. CONTRAST:  OMNIPAQUE IOHEXOL 350 MG/ML SOLN COMPARISON:  06/01/2023 FINDINGS: CTA CHEST FINDINGS Cardiovascular: Heart is normal size. Aorta is normal caliber. No evidence of dissection. No filling defects in the pulmonary arteries to suggest pulmonary emboli. Mediastinum/Nodes: No mediastinal, hilar, or axillary adenopathy. Trachea and esophagus are unremarkable. Thyroid unremarkable. Lungs/Pleura: Lungs are clear. No focal airspace opacities or suspicious nodules. No effusions. No pneumothorax. Musculoskeletal: Chest wall soft tissues are unremarkable. No acute bony abnormality. Review of the MIP images confirms the above findings. CTA ABDOMEN AND PELVIS FINDINGS VASCULAR Aorta: Normal caliber aorta without aneurysm, dissection, vasculitis or significant stenosis. Celiac: Patent without evidence of aneurysm, dissection, vasculitis or significant stenosis. SMA: Patent without evidence of aneurysm, dissection, vasculitis or significant stenosis. Renals: Both renal arteries are patent without evidence of aneurysm, dissection, vasculitis, fibromuscular dysplasia or significant stenosis. IMA: Patent without evidence of aneurysm, dissection, vasculitis or significant stenosis. Inflow: Patent without evidence of aneurysm, dissection, vasculitis or significant stenosis. Veins: No obvious venous abnormality within the limitations of this arterial phase study. Review of the MIP images confirms the above findings. NON-VASCULAR Hepatobiliary: Diffuse low-density throughout the liver compatible with fatty infiltration. No focal abnormality. Gallbladder unremarkable. Pancreas: No focal abnormality or ductal dilatation. Spleen: No focal abnormality.  Normal size. Adrenals/Urinary Tract: No adrenal abnormality. No focal renal abnormality. No stones or  hydronephrosis. Urinary bladder is unremarkable. Stomach/Bowel: Stomach, large and small bowel grossly unremarkable. Lymphatic: No adenopathy Reproductive: No visible focal abnormality. Other: No free fluid or free air. Musculoskeletal: No acute bony abnormality. Review of the MIP images confirms the above findings. IMPRESSION: No evidence of aortic aneurysm or dissection. No evidence of pulmonary embolus. No acute findings in the chest, abdomen or pelvis. Hepatic steatosis. Electronically Signed   By: Charlett Nose M.D.   On: 08/13/2023 01:27   DG Chest 2 View Result Date: 08/13/2023 CLINICAL DATA:  Chest pain EXAM: CHEST - 2 VIEW COMPARISON:  04/07/2020 FINDINGS: The heart size and mediastinal contours are within normal limits. Both lungs are clear. The visualized skeletal structures are unremarkable. IMPRESSION: No active cardiopulmonary disease. Electronically Signed   By: Charlett Nose M.D.   On: 08/13/2023 00:50        Scheduled Meds:  chlorhexidine       sodium chloride flush  3 mL Intravenous Q12H   Continuous Infusions:  sodium chloride Stopped (08/14/23 0440)   acyclovir Stopped (08/14/23 0332)   cefTRIAXone (ROCEPHIN)  IV Stopped (08/13/23 2347)   vancomycin 150 mL/hr at 08/14/23 0502          Glade Lloyd, MD [facility] 08/14/2023, 6:43 AM

## 2023-08-14 NOTE — H&P (Signed)
 Referring Physician(s): Erick Blinks, MD   Supervising Physician: Marliss Coots  Patient Status:  [facility] - In-pt  Chief Complaint: headaches, left sided weakness and fevers - IR requested to perform LP under general anesthesia.   Subjective: Patient seen in SS #[id number]. He is being prepared for imaging studies and a lumbar puncture in IR. He appears uncomfortable with the bright lights in the room. Patient is still in agreement for lumbar puncture today in IR. Please see full H&P written 08/13/23 at 1425.   Allergies: Patient has no known allergies.  Medications: Prior to Admission medications   Not on File     Vital Signs: BP 124/87   Pulse 94   Temp 98.1 F (36.7 C) (Oral)   Resp 18   Ht 6\' 2"  (1.88 m)   Wt (!) 320 lb (145.2 kg)   SpO2 98%   BMI 41.09 kg/m   Physical Exam Constitutional:      General: He is not in acute distress.    Appearance: He is not ill-appearing.  Cardiovascular:     Rate and Rhythm: Normal rate.  Pulmonary:     Effort: Pulmonary effort is normal.  Skin:    General: Skin is warm and dry.  Neurological:     Mental Status: He is alert and oriented to person, place, and time.     Imaging: CT ANGIO HEAD NECK W WO CM Result Date: 08/13/2023 CLINICAL DATA:  Initial evaluation for acute neuro deficit, stroke suspected. EXAM: CT ANGIOGRAPHY HEAD AND NECK WITH AND WITHOUT CONTRAST TECHNIQUE: Multidetector CT imaging of the head and neck was performed using the standard protocol during bolus administration of intravenous contrast. Multiplanar CT image reconstructions and MIPs were obtained to evaluate the vascular anatomy. Carotid stenosis measurements (when applicable) are obtained utilizing NASCET criteria, using the distal internal carotid diameter as the denominator. RADIATION DOSE REDUCTION: This exam was performed according to the departmental dose-optimization program which includes automated exposure control, adjustment of the mA and/or kV  according to patient size and/or use of iterative reconstruction technique. CONTRAST:  OMNIPAQUE IOHEXOL 350 MG/ML SOLN COMPARISON:  Comparison made with head CT performed on the same day. FINDINGS: CTA NECK FINDINGS Aortic arch: Standard branching. Imaged portion shows no evidence of aneurysm or dissection. No significant stenosis of the major arch vessel origins. Right carotid system: No evidence of dissection, stenosis (50% or greater), or occlusion. Left carotid system: No evidence of dissection, stenosis (50% or greater), or occlusion. Vertebral arteries: No evidence of dissection, stenosis (50% or greater), or occlusion. Skeleton: No discrete or worrisome osseous lesions. Other neck: No other acute finding. Upper chest: No other acute finding. Review of the MIP images confirms the above findings CTA HEAD FINDINGS Anterior circulation: Both internal carotid arteries widely patent to the termini without stenosis. A1 segments widely patent. Normal anterior communicating artery complex. Both anterior cerebral arteries widely patent to their distal aspects without stenosis. No M1 stenosis or occlusion. Normal MCA bifurcations. Distal MCA branches well perfused and symmetric. Posterior circulation: Both V4 segments patent to the vertebrobasilar junction without stenosis. Both PICA origins patent and normal. Basilar widely patent to its distal aspect without stenosis. Superior cerebellar arteries patent bilaterally. Both PCAs primarily supplied via the basilar and are well perfused to there distal aspects. Venous sinuses: Patent allowing for timing the contrast bolus. Anatomic variants: None significant.  No aneurysm. Review of the MIP images confirms the above findings IMPRESSION: Normal CTA of the head and neck. No  large vessel occlusion, hemodynamically significant stenosis, or other acute vascular abnormality. Electronically Signed   By: Rise Mu M.D.   On: 08/13/2023 02:10   CT Head Wo  Contrast Result Date: 08/13/2023 CLINICAL DATA:  Headache EXAM: CT HEAD WITHOUT CONTRAST TECHNIQUE: Contiguous axial images were obtained from the base of the skull through the vertex without intravenous contrast. RADIATION DOSE REDUCTION: This exam was performed according to the departmental dose-optimization program which includes automated exposure control, adjustment of the mA and/or kV according to patient size and/or use of iterative reconstruction technique. COMPARISON:  None Available. FINDINGS: Brain: No acute intracranial abnormality. Specifically, no hemorrhage, hydrocephalus, mass lesion, acute infarction, or significant intracranial injury. Vascular: No hyperdense vessel or unexpected calcification. Skull: No acute calvarial abnormality. Sinuses/Orbits: No acute findings Other: None IMPRESSION: No acute intracranial abnormality. Electronically Signed   By: Charlett Nose M.D.   On: 08/13/2023 01:32   CT Angio Chest/Abd/Pel for Dissection W and/or Wo Contrast Result Date: 08/13/2023 CLINICAL DATA:  Acute aortic syndrome (AAS) suspected. Chest pain radiating to left arm and left leg. Nausea. EXAM: CT ANGIOGRAPHY CHEST, ABDOMEN AND PELVIS TECHNIQUE: Non-contrast CT of the chest was initially obtained. Multidetector CT imaging through the chest, abdomen and pelvis was performed using the standard protocol during bolus administration of intravenous contrast. Multiplanar reconstructed images and MIPs were obtained and reviewed to evaluate the vascular anatomy. RADIATION DOSE REDUCTION: This exam was performed according to the departmental dose-optimization program which includes automated exposure control, adjustment of the mA and/or kV according to patient size and/or use of iterative reconstruction technique. CONTRAST:  OMNIPAQUE IOHEXOL 350 MG/ML SOLN COMPARISON:  06/01/2023 FINDINGS: CTA CHEST FINDINGS Cardiovascular: Heart is normal size. Aorta is normal caliber. No evidence of dissection. No  filling defects in the pulmonary arteries to suggest pulmonary emboli. Mediastinum/Nodes: No mediastinal, hilar, or axillary adenopathy. Trachea and esophagus are unremarkable. Thyroid unremarkable. Lungs/Pleura: Lungs are clear. No focal airspace opacities or suspicious nodules. No effusions. No pneumothorax. Musculoskeletal: Chest wall soft tissues are unremarkable. No acute bony abnormality. Review of the MIP images confirms the above findings. CTA ABDOMEN AND PELVIS FINDINGS VASCULAR Aorta: Normal caliber aorta without aneurysm, dissection, vasculitis or significant stenosis. Celiac: Patent without evidence of aneurysm, dissection, vasculitis or significant stenosis. SMA: Patent without evidence of aneurysm, dissection, vasculitis or significant stenosis. Renals: Both renal arteries are patent without evidence of aneurysm, dissection, vasculitis, fibromuscular dysplasia or significant stenosis. IMA: Patent without evidence of aneurysm, dissection, vasculitis or significant stenosis. Inflow: Patent without evidence of aneurysm, dissection, vasculitis or significant stenosis. Veins: No obvious venous abnormality within the limitations of this arterial phase study. Review of the MIP images confirms the above findings. NON-VASCULAR Hepatobiliary: Diffuse low-density throughout the liver compatible with fatty infiltration. No focal abnormality. Gallbladder unremarkable. Pancreas: No focal abnormality or ductal dilatation. Spleen: No focal abnormality.  Normal size. Adrenals/Urinary Tract: No adrenal abnormality. No focal renal abnormality. No stones or hydronephrosis. Urinary bladder is unremarkable. Stomach/Bowel: Stomach, large and small bowel grossly unremarkable. Lymphatic: No adenopathy Reproductive: No visible focal abnormality. Other: No free fluid or free air. Musculoskeletal: No acute bony abnormality. Review of the MIP images confirms the above findings. IMPRESSION: No evidence of aortic aneurysm or  dissection. No evidence of pulmonary embolus. No acute findings in the chest, abdomen or pelvis. Hepatic steatosis. Electronically Signed   By: Charlett Nose M.D.   On: 08/13/2023 01:27   DG Chest 2 View Result Date: 08/13/2023 CLINICAL DATA:  Chest pain EXAM: CHEST -  2 VIEW COMPARISON:  04/07/2020 FINDINGS: The heart size and mediastinal contours are within normal limits. Both lungs are clear. The visualized skeletal structures are unremarkable. IMPRESSION: No active cardiopulmonary disease. Electronically Signed   By: Charlett Nose M.D.   On: 08/13/2023 00:50    Labs:  CBC: Recent Labs    08/12/23 2353  WBC 14.0*  HGB 16.5  HCT 46.2  PLT 240    COAGS: No results for input(s): "INR", "APTT" in the last 8760 hours.  BMP: Recent Labs    08/12/23 2353  NA 134*  K 3.9  CL 100  CO2 23  GLUCOSE 100*  BUN 17  CALCIUM 9.1  CREATININE 1.12  GFRNONAA >60    LIVER FUNCTION TESTS: No results for input(s): "BILITOT", "AST", "ALT", "ALKPHOS", "PROT", "ALBUMIN" in the last 8760 hours.  Assessment and Plan:  Headaches, left sided weakness and fevers   Patient is a 38 y/o male without significant medical history. Patient presented to the ER with chest pain and left sided weakness. Subsequent work up revealed negative COVID, Flu and RSV.  EKG and chest xray were unremarkable. CT agio chest/abdomen/pelvis and CT angio head/neck negative. IR consulted to assist with a LP and coordinate this with anesthesia. Patient is scheduled for MRI of the brain and cervical spine at 8 am 08/14/23 and he will be brought to IR 2 by anesthesia for a fluoro guided LP.   Risks and benefits of lumbar puncture were discussed with the patient including bleeding, infection, damage to adjacent structures, leakage of spinal fluid and headache requiring blood patch.   All questions were answered, patient is agreeable to proceed.  Consent signed and in chart.    Electronically Signed: Alwyn Ren,  AGACNP-BC 08/14/2023, 7:28 AM   I spent a total of 15 Minutes at the the patient's bedside AND on the patient's hospital floor or unit, greater than 50% of which was counseling/coordinating care for headaches, weakness, fevers.

## 2023-08-14 NOTE — Evaluation (Signed)
 Physical Therapy Evaluation Patient Details Name: Ryan Griffith MRN: [id number] DOB: Dec 20, 1985 Today's Date: 08/14/2023  History of Present Illness  Patient is a 38 y.o. male presented with chest pain and severe headache with left upper extremity weakness/numbness with nausea and was found to be febrile on presentation with extremely elevated blood pressure. CT of head and neck neg. MRI of head and neck neg. LBP performed and neg for meningitis.   Clinical Impression  Ryan Griffith is 38 y.o. male admitted with above HPI and diagnosis. Patient is currently limited by functional impairments below (see PT problem list). Patient lives with children and is independent at baseline. Currently pt limited by Lt UE/LE weakness and impaired motor activation/planning. Pt noted to have poor activation in Lt LE with MMT but good activation with functional transfers. Pt unable to coordinate Lt stepping and required Mod assist to weight shift for attempts along EOB. Pt noted to have impaired/disconjugate visual tracking. Pt appears frustrated and stressed throughout evaluation and reported 8/10 headache. Patient will benefit from continued skilled PT interventions to address impairments and progress independence with mobility. Patient will benefit from intensive inpatient follow-up therapy, >3 hours/day. Acute PT will follow and progress as able.         If plan is discharge home, recommend the following: A little help with walking and/or transfers;A little help with bathing/dressing/bathroom;Assistance with cooking/housework;Assist for transportation   Can travel by private vehicle        Equipment Recommendations  (TBD)  Recommendations for Other Services  Rehab consult    Functional Status Assessment Patient has had a recent decline in their functional status and demonstrates the ability to make significant improvements in function in a reasonable and predictable amount of time.      Precautions / Restrictions Precautions Precautions: Fall Recall of Precautions/Restrictions: Intact Restrictions Weight Bearing Restrictions Per Provider Order: No      Mobility  Bed Mobility Overal bed mobility: Needs Assistance Bed Mobility: Supine to Sit, Sit to Supine     Supine to sit: Used rails, Mod assist Sit to supine: Min assist, Used rails   General bed mobility comments: Pt able to roll Lt with Rt UE and bring Le's off EOB. pt unable to activate through Lt UE and Mod assist to raise trunk. Pt able to control lowering trunk to supine and min assist to bring Lt LE onto bed.    Transfers Overall transfer level: Needs assistance Equipment used: 2 person hand held assist Transfers: Sit to/from Stand Sit to Stand: Min assist, +2 safety/equipment           General transfer comment: pt using Rt UE to power up, able to activate on Lt Le to rise then shifting weight to Rt LE for standing balance. Pt avoiding use of Lt UE. pt also able to perform lateral scoot along bed to move towards [facility] with good activation felt on bil LE's and Rt UE use of push/scoot.    Ambulation/Gait                  Stairs            Wheelchair Mobility     Tilt Bed    Modified Rankin (Stroke Patients Only)       Balance Overall balance assessment: Needs assistance Sitting-balance support: Feet supported Sitting balance-Leahy Scale: Good     Standing balance support: During functional activity, Single extremity supported, Bilateral upper extremity supported Standing balance-Leahy Scale: Poor Standing balance  comment: reliant on external support                             Pertinent Vitals/Pain Pain Assessment Pain Assessment: 0-10 Pain Score: 8  Pain Location: headache Pain Descriptors / Indicators: Headache Pain Intervention(s): Limited activity within patient's tolerance, Monitored during session, Premedicated before session, Repositioned    Home  Living Family/patient expects to be discharged to:: Private residence Living Arrangements: Children Available Help at Discharge: Family Type of Home: House Home Access: Stairs to enter Entrance Stairs-Rails: Doctor, general practice of Steps: 4   Home Layout: One level   Additional Comments: Buyer, retail    Prior Function Prior Level of Function : Independent/Modified Independent;Driving;Working/employed               ADLs Comments: Caring for children and working     Extremity/Trunk Assessment   Upper Extremity Assessment Upper Extremity Assessment: LUE deficits/detail;Left hand dominant LUE Deficits / Details: Patient can move through ROM, no tone increased tone noted, more resistance with range. Upon entering patient was using phone and typing with both hands.  Overall strength WFL. (IV in L hand prohibiting some movement)    Lower Extremity Assessment Lower Extremity Assessment: RLE deficits/detail;LLE deficits/detail RLE Deficits / Details: 5/5 throughout, ROM WNL RLE Sensation: WNL RLE Coordination: WNL LLE Deficits / Details: pt with inconsistent tone/resistance to ROM, no true hypertonicity noted with various velocity testing for ROM. pt with greater proximal weakness with 2+/5 or less for hip flex, add/abd, and 3+/5 for knee ext/flex and DF. LLE Sensation: WNL LLE Coordination: decreased gross motor;decreased fine motor    Cervical / Trunk Assessment Cervical / Trunk Assessment: Normal  Communication   Communication Communication: No apparent difficulties    Cognition Arousal: Alert, Lethargic, Suspect due to medications Behavior During Therapy: WFL for tasks assessed/performed   PT - Cognitive impairments: No apparent impairments                         Following commands: Intact       Cueing Cueing Techniques: Verbal cues     General Comments      Exercises     Assessment/Plan    PT Assessment Patient needs  continued PT services  PT Problem List Decreased strength;Decreased range of motion;Decreased activity tolerance;Decreased balance;Decreased mobility;Decreased coordination;Decreased knowledge of use of DME;Decreased safety awareness;Decreased knowledge of precautions;Obesity;Pain       PT Treatment Interventions DME instruction;Gait training;Stair training;Functional mobility training;Therapeutic activities;Therapeutic exercise;Balance training;Neuromuscular re-education;Cognitive remediation;Patient/family education    PT Goals (Current goals can be found in the Care Plan section)  Acute Rehab PT Goals Patient Stated Goal: regain function PT Goal Formulation: With patient/family Time For Goal Achievement: 08/28/23 Potential to Achieve Goals: Good    Frequency Min 3X/week     Co-evaluation   Reason for Co-Treatment: Complexity of the patient's impairments (multi-system involvement)           AM-PAC PT "6 Clicks" Mobility  Outcome Measure Help needed turning from your back to your side while in a flat bed without using bedrails?: A Little Help needed moving from lying on your back to sitting on the side of a flat bed without using bedrails?: A Lot Help needed moving to and from a bed to a chair (including a wheelchair)?: A Lot Help needed standing up from a chair using your arms (e.g., wheelchair or bedside chair)?: A Little Help needed  to walk in hospital room?: A Lot Help needed climbing 3-5 steps with a railing? : Total 6 Click Score: 13    End of Session Equipment Utilized During Treatment: Gait belt Activity Tolerance: Patient tolerated treatment well Patient left: in bed;with bed alarm set;with call bell/phone within reach;with family/visitor present Nurse Communication: Mobility status PT Visit Diagnosis: Muscle weakness (generalized) (M62.81);Difficulty in walking, not elsewhere classified (R26.2);Other symptoms and signs involving the nervous system  (R29.898);Pain;Other abnormalities of gait and mobility (R26.89);Hemiplegia and hemiparesis;Unsteadiness on feet (R26.81) Hemiplegia - Right/Left: Left Hemiplegia - dominant/non-dominant: Dominant Hemiplegia - caused by: Unspecified Pain - part of body:  (headache)    Time: 7829-5621 PT Time Calculation (min) (ACUTE ONLY): 41 min   Charges:   PT Evaluation $PT Eval Moderate Complexity: 1 Mod PT Treatments $Therapeutic Activity: 8-22 mins PT General Charges $$ ACUTE PT VISIT: 1 Visit         Wynn Maudlin, DPT Acute Rehabilitation Services Office [phone]  08/14/23 5:50 PM

## 2023-08-14 NOTE — Transfer of Care (Signed)
 Immediate Anesthesia Transfer of Care Note  Patient: Ryan Griffith  Procedure(s) Performed: MRI WITH ANESTHESIA  Patient Location: PACU  Anesthesia Type:General  Level of Consciousness: sedated  Airway & Oxygen Therapy: Patient Spontanous Breathing and Patient connected to face mask oxygen  Post-op Assessment: Report given to RN and Post -op Vital signs reviewed and stable  Post vital signs: Reviewed and stable  Last Vitals:  Vitals Value Taken Time  BP 117/87 08/14/23 1119  Temp    Pulse 101 08/14/23 1126  Resp 15 08/14/23 1126  SpO2 99 % 08/14/23 1126  Vitals shown include unfiled device data.  Last Pain:  Vitals:   08/14/23 0720  TempSrc: Oral  PainSc: 7          Complications: No notable events documented.

## 2023-08-14 NOTE — Discharge Summary (Signed)
 Physician Discharge Summary  Ryan Griffith ZOX:[id number] DOB: 09-30-1985 DOA: 08/12/2023  PCP: Patient, No Pcp Per  Admit date: 08/12/2023 Discharge date: 08/14/2023  Admitted From: Home Disposition: Home  Recommendations for Outpatient Follow-up:  Follow up with PCP in 1 week  Outpatient follow-up with neurology if needed.   Follow up in ED if symptoms worsen or new appear   Home Health: No Equipment/Devices: None  Discharge Condition: Stable CODE STATUS: Full Diet recommendation: Regular  Brief/Interim Summary: 38 year old male with no past medical history presented with chest pain and severe headache with left upper extremity weakness/numbness with nausea and was found to be febrile on presentation with extremely elevated blood pressure.  On presentation, CT angio head and neck and CT head without contrast were negative for acute intracranial abnormality and LVO. CT angio chest/abdomen/pelvis was negative for aortic aneurysm/dissection or pulmonary embolus.  Neurology recommended MRI of brain and cervical spine along with LP.  Patient started on broad-spectrum antibiotics and antivirals.  Subsequently, MRI of brain and cervical spine done under anesthesia where negative for acute abnormality.  LP was done which ruled out meningitis and antibiotics/antivirals were discontinued by neurology and neurology has cleared him for discharge.  Discharge patient home today.  Discharge Diagnoses:   Severe headache with left upper extremity weakness/numbness along with fever -Imaging as above.  Rule out meningitis/discitis. -Neurology following: recommended MRI of brain and cervical spine along with LP.  Because of claustrophobia, MRI and LP to be attempted under anesthesia. -Started on broad-spectrum antibiotics and antivirals as per neurology recommendations. -Subsequently, MRI of brain and cervical spine done under anesthesia where negative for acute abnormality.  LP was done which ruled  out meningitis and antibiotics/antivirals were discontinued by neurology and neurology has cleared him for discharge.  Discharge patient home today.   Atypical chest pain -EKG and troponins unremarkable.  CTA chest negative for PE/aortic dissection/embolism -Improved.   Leukocytosis -Improving.  Hyponatremia -Mild.  Monitor intermittently as an outpatient.   Obesity class III -Outpatient follow-up     Discharge Instructions  Discharge Instructions     Diet general   Complete by: As directed    Increase activity slowly   Complete by: As directed       Allergies as of 08/14/2023   No Known Allergies      Medication List     TAKE these medications    ibuprofen 400 MG tablet Commonly known as: ADVIL Take 1 tablet (400 mg total) by mouth every 6 (six) hours as needed for headache, fever, mild pain (pain score 1-3) or moderate pain (pain score 4-6).   polyethylene glycol 17 g packet Commonly known as: MIRALAX / GLYCOLAX Take 17 g by mouth daily as needed for moderate constipation.        Follow-up Information     PCP. Schedule an appointment as soon as possible for a visit in 1 week(s).                 No Known Allergies  Consultations: Neurology/IR   Procedures/Studies: IR LUMBAR PUNCTURE Result Date: 08/14/2023 CLINICAL DATA:  Patient is a 38 y/o male with headaches, left sided weakness and fevers. IR consulted for a diagnostic lumbar puncture with anesthesia. EXAM: LUMBAR PUNCTURE UNDER FLUOROSCOPY PROCEDURE: An appropriate skin entry site was determined fluoroscopically. Operator donned sterile gloves and mask. Skin site was marked, then prepped with Betadine, draped in usual sterile fashion, and infiltrated locally with 1% lidocaine. A 6 inch 20 gauge spinal needle  was advanced into the thecal sac at L4-L5 from a left interlaminar approach. Clear colorless CSF spontaneously returned, with an opening pressure of 27 cm water (measured prone). 11 mL CSF  were collected and divided among 4 sterile vials for the requested laboratory studies. The needle was then removed. The patient tolerated the procedure well and there were no complications. This exam was performed by [facility], and was supervised and interpreted by Dr. Sebastian Ache. FLUOROSCOPY: Radiation Exposure Index (as provided by the fluoroscopic device): 150 mGy Kerma IMPRESSION: Technically successful lumbar puncture under fluoroscopy. Electronically Signed   By: Sebastian Ache M.D.   On: 08/14/2023 12:45   MR Cervical Spine W and Wo Contrast Result Date: 08/14/2023 CLINICAL DATA:  Left-sided weakness, headache, and fever. EXAM: MRI CERVICAL SPINE WITHOUT AND WITH CONTRAST TECHNIQUE: Multiplanar and multiecho pulse sequences of the cervical spine, to include the craniocervical junction and cervicothoracic junction, were obtained without and with intravenous contrast. CONTRAST:  10mL GADAVIST GADOBUTROL 1 MMOL/ML IV SOLN COMPARISON:  Cervical spine MRI 01/10/2020 FINDINGS: Alignment: Normal. Vertebrae: No fracture, suspicious marrow lesion, significant marrow edema, or evidence of discitis. Cord: Normal signal and morphology.  No abnormal enhancement. Posterior Fossa, vertebral arteries, paraspinal tissues: Fluid/secretions in the pharynx in the setting of intubation. Preserved vertebral artery flow voids. Disc levels: Preserved disc heights.  No disc herniation or stenosis. IMPRESSION: Negative cervical spine MRI. Electronically Signed   By: Sebastian Ache M.D.   On: 08/14/2023 10:03   MR Brain W and Wo Contrast Result Date: 08/14/2023 CLINICAL DATA:  Left-sided weakness, headache, and fever. EXAM: MRI HEAD WITHOUT AND WITH CONTRAST TECHNIQUE: Multiplanar, multiecho pulse sequences of the brain and surrounding structures were obtained without and with intravenous contrast. CONTRAST:  10mL GADAVIST GADOBUTROL 1 MMOL/ML IV SOLN COMPARISON:  Head CT and CTA 08/13/2023 FINDINGS: Brain: There is no  evidence of an acute infarct, intracranial hemorrhage, mass, midline shift, or extra-axial fluid collection. Cerebral volume is normal. The ventricles are normal in size. The cerebellar tonsils are normally positioned. There are 8-10 punctate foci of T2 FLAIR hyperintensity primarily involving the subcortical white matter of the frontal lobes. There is no significant periventricular involvement. The corpus callosum, brainstem, and cerebellum are normal in signal. No abnormal brain parenchymal or meningeal enhancement is identified. Vascular: Major intracranial vascular flow voids are preserved. Normal enhancement of the dural venous sinuses. Skull and upper cervical spine: Unremarkable bone marrow signal. Sinuses/Orbits: Unremarkable orbits. Minimal mucosal thickening in the paranasal sinuses. Clear mastoid air cells. Other: Fluid in the pharynx related to intubation. IMPRESSION: 1. No acute intracranial abnormality. 2. Minimal cerebral white matter T2 signal changes, nonspecific though may reflect early chronic small vessel ischemia, migraines, or prior infection/inflammation. No specific findings of demyelinating disease. Electronically Signed   By: Sebastian Ache M.D.   On: 08/14/2023 09:59   CT ANGIO HEAD NECK W WO CM Result Date: 08/13/2023 CLINICAL DATA:  Initial evaluation for acute neuro deficit, stroke suspected. EXAM: CT ANGIOGRAPHY HEAD AND NECK WITH AND WITHOUT CONTRAST TECHNIQUE: Multidetector CT imaging of the head and neck was performed using the standard protocol during bolus administration of intravenous contrast. Multiplanar CT image reconstructions and MIPs were obtained to evaluate the vascular anatomy. Carotid stenosis measurements (when applicable) are obtained utilizing NASCET criteria, using the distal internal carotid diameter as the denominator. RADIATION DOSE REDUCTION: This exam was performed according to the departmental dose-optimization program which includes automated exposure  control, adjustment of the mA and/or kV according  to patient size and/or use of iterative reconstruction technique. CONTRAST:  OMNIPAQUE IOHEXOL 350 MG/ML SOLN COMPARISON:  Comparison made with head CT performed on the same day. FINDINGS: CTA NECK FINDINGS Aortic arch: Standard branching. Imaged portion shows no evidence of aneurysm or dissection. No significant stenosis of the major arch vessel origins. Right carotid system: No evidence of dissection, stenosis (50% or greater), or occlusion. Left carotid system: No evidence of dissection, stenosis (50% or greater), or occlusion. Vertebral arteries: No evidence of dissection, stenosis (50% or greater), or occlusion. Skeleton: No discrete or worrisome osseous lesions. Other neck: No other acute finding. Upper chest: No other acute finding. Review of the MIP images confirms the above findings CTA HEAD FINDINGS Anterior circulation: Both internal carotid arteries widely patent to the termini without stenosis. A1 segments widely patent. Normal anterior communicating artery complex. Both anterior cerebral arteries widely patent to their distal aspects without stenosis. No M1 stenosis or occlusion. Normal MCA bifurcations. Distal MCA branches well perfused and symmetric. Posterior circulation: Both V4 segments patent to the vertebrobasilar junction without stenosis. Both PICA origins patent and normal. Basilar widely patent to its distal aspect without stenosis. Superior cerebellar arteries patent bilaterally. Both PCAs primarily supplied via the basilar and are well perfused to there distal aspects. Venous sinuses: Patent allowing for timing the contrast bolus. Anatomic variants: None significant.  No aneurysm. Review of the MIP images confirms the above findings IMPRESSION: Normal CTA of the head and neck. No large vessel occlusion, hemodynamically significant stenosis, or other acute vascular abnormality. Electronically Signed   By: Rise Mu M.D.    On: 08/13/2023 02:10   CT Head Wo Contrast Result Date: 08/13/2023 CLINICAL DATA:  Headache EXAM: CT HEAD WITHOUT CONTRAST TECHNIQUE: Contiguous axial images were obtained from the base of the skull through the vertex without intravenous contrast. RADIATION DOSE REDUCTION: This exam was performed according to the departmental dose-optimization program which includes automated exposure control, adjustment of the mA and/or kV according to patient size and/or use of iterative reconstruction technique. COMPARISON:  None Available. FINDINGS: Brain: No acute intracranial abnormality. Specifically, no hemorrhage, hydrocephalus, mass lesion, acute infarction, or significant intracranial injury. Vascular: No hyperdense vessel or unexpected calcification. Skull: No acute calvarial abnormality. Sinuses/Orbits: No acute findings Other: None IMPRESSION: No acute intracranial abnormality. Electronically Signed   By: Charlett Nose M.D.   On: 08/13/2023 01:32   CT Angio Chest/Abd/Pel for Dissection W and/or Wo Contrast Result Date: 08/13/2023 CLINICAL DATA:  Acute aortic syndrome (AAS) suspected. Chest pain radiating to left arm and left leg. Nausea. EXAM: CT ANGIOGRAPHY CHEST, ABDOMEN AND PELVIS TECHNIQUE: Non-contrast CT of the chest was initially obtained. Multidetector CT imaging through the chest, abdomen and pelvis was performed using the standard protocol during bolus administration of intravenous contrast. Multiplanar reconstructed images and MIPs were obtained and reviewed to evaluate the vascular anatomy. RADIATION DOSE REDUCTION: This exam was performed according to the departmental dose-optimization program which includes automated exposure control, adjustment of the mA and/or kV according to patient size and/or use of iterative reconstruction technique. CONTRAST:  OMNIPAQUE IOHEXOL 350 MG/ML SOLN COMPARISON:  06/01/2023 FINDINGS: CTA CHEST FINDINGS Cardiovascular: Heart is normal size. Aorta is normal  caliber. No evidence of dissection. No filling defects in the pulmonary arteries to suggest pulmonary emboli. Mediastinum/Nodes: No mediastinal, hilar, or axillary adenopathy. Trachea and esophagus are unremarkable. Thyroid unremarkable. Lungs/Pleura: Lungs are clear. No focal airspace opacities or suspicious nodules. No effusions. No pneumothorax. Musculoskeletal: Chest wall soft tissues  are unremarkable. No acute bony abnormality. Review of the MIP images confirms the above findings. CTA ABDOMEN AND PELVIS FINDINGS VASCULAR Aorta: Normal caliber aorta without aneurysm, dissection, vasculitis or significant stenosis. Celiac: Patent without evidence of aneurysm, dissection, vasculitis or significant stenosis. SMA: Patent without evidence of aneurysm, dissection, vasculitis or significant stenosis. Renals: Both renal arteries are patent without evidence of aneurysm, dissection, vasculitis, fibromuscular dysplasia or significant stenosis. IMA: Patent without evidence of aneurysm, dissection, vasculitis or significant stenosis. Inflow: Patent without evidence of aneurysm, dissection, vasculitis or significant stenosis. Veins: No obvious venous abnormality within the limitations of this arterial phase study. Review of the MIP images confirms the above findings. NON-VASCULAR Hepatobiliary: Diffuse low-density throughout the liver compatible with fatty infiltration. No focal abnormality. Gallbladder unremarkable. Pancreas: No focal abnormality or ductal dilatation. Spleen: No focal abnormality.  Normal size. Adrenals/Urinary Tract: No adrenal abnormality. No focal renal abnormality. No stones or hydronephrosis. Urinary bladder is unremarkable. Stomach/Bowel: Stomach, large and small bowel grossly unremarkable. Lymphatic: No adenopathy Reproductive: No visible focal abnormality. Other: No free fluid or free air. Musculoskeletal: No acute bony abnormality. Review of the MIP images confirms the above findings. IMPRESSION: No  evidence of aortic aneurysm or dissection. No evidence of pulmonary embolus. No acute findings in the chest, abdomen or pelvis. Hepatic steatosis. Electronically Signed   By: Charlett Nose M.D.   On: 08/13/2023 01:27   DG Chest 2 View Result Date: 08/13/2023 CLINICAL DATA:  Chest pain EXAM: CHEST - 2 VIEW COMPARISON:  04/07/2020 FINDINGS: The heart size and mediastinal contours are within normal limits. Both lungs are clear. The visualized skeletal structures are unremarkable. IMPRESSION: No active cardiopulmonary disease. Electronically Signed   By: Charlett Nose M.D.   On: 08/13/2023 00:50      Subjective: Patient seen and examined at bedside. Continues to have intermittent headache. No chest pain, vomiting reported.   Discharge Exam: Vitals:   08/14/23 1145 08/14/23 1216  BP: 125/78 121/70  Pulse: 98 99  Resp: 15   Temp: 98.4 F (36.9 C) 98.4 F (36.9 C)  SpO2: 95% 94%   General exam: Appears calm and comfortable  Respiratory system: Bilateral decreased breath sounds at bases Cardiovascular system: S1 & S2 heard, Rate controlled Gastrointestinal system: Abdomen is morbidly obese, nondistended, soft and nontender. Normal bowel sounds heard. Extremities: No cyanosis, clubbing, edema  Central nervous system: Slow to respond.   Skin: No rashes, lesions or ulcers Psychiatry: Flat affect.  Not agitated.    The results of significant diagnostics from this hospitalization (including imaging, microbiology, ancillary and laboratory) are listed below for reference.     Microbiology: Recent Results (from the past 240 hours)  Resp panel by RT-PCR (RSV, Flu A&B, Covid) Anterior Nasal Swab     Status: None   Collection Time: 08/12/23 11:52 PM   Specimen: Anterior Nasal Swab  Result Value Ref Range Status   SARS Coronavirus 2 by RT PCR NEGATIVE NEGATIVE Final   Influenza A by PCR NEGATIVE NEGATIVE Final   Influenza B by PCR NEGATIVE NEGATIVE Final    Comment: (NOTE) The Xpert Xpress  SARS-CoV-2/FLU/RSV plus assay is intended as an aid in the diagnosis of influenza from Nasopharyngeal swab specimens and should not be used as a sole basis for treatment. Nasal washings and aspirates are unacceptable for Xpert Xpress SARS-CoV-2/FLU/RSV testing.  Fact Sheet for Patients: BloggerCourse.com  Fact Sheet for Healthcare Providers: SeriousBroker.it  This test is not yet approved or cleared by the [place] and  has been authorized for detection and/or diagnosis of SARS-CoV-2 by FDA under an Emergency Use Authorization (EUA). This EUA will remain in effect (meaning this test can be used) for the duration of the COVID-19 declaration under Section 564(b)(1) of the Act, 21 U.S.C. section 360bbb-3(b)(1), unless the authorization is terminated or revoked.     Resp Syncytial Virus by PCR NEGATIVE NEGATIVE Final    Comment: (NOTE) Fact Sheet for Patients: BloggerCourse.com  Fact Sheet for Healthcare Providers: SeriousBroker.it  This test is not yet approved or cleared by the [place] FDA and has been authorized for detection and/or diagnosis of SARS-CoV-2 by FDA under an Emergency Use Authorization (EUA). This EUA will remain in effect (meaning this test can be used) for the duration of the COVID-19 declaration under Section 564(b)(1) of the Act, 21 U.S.C. section 360bbb-3(b)(1), unless the authorization is terminated or revoked.  Performed at [facility], [address], [place], [place] [address]   CSF culture w Gram Stain     Status: None (Preliminary result)   Collection Time: 08/14/23 10:45 AM   Specimen: PATH Cytology CSF; Cerebrospinal Fluid  Result Value Ref Range Status   Specimen Description CSF  Final   Special Requests NONE  Final   Gram Stain   Final    WBC PRESENT, PREDOMINANTLY MONONUCLEAR NO ORGANISMS SEEN CYTOSPIN SMEAR Performed at  [facility], [address] [place], [place] [address]    Culture PENDING  Incomplete   Report Status PENDING  Incomplete     Labs: BNP (last 3 results) No results for input(s): "BNP" in the last 8760 hours. Basic Metabolic Panel: Recent Labs  Lab 08/12/23 2353 08/14/23 0633  NA 134* 133*  K 3.9 3.7  CL 100 98  CO2 23 27  GLUCOSE 100* 100*  BUN 17 8  CREATININE 1.12 0.99  CALCIUM 9.1 8.1*   Liver Function Tests: Recent Labs  Lab 08/14/23 0633  AST 21  ALT 39  ALKPHOS 38  BILITOT 0.9  PROT 6.1*  ALBUMIN 3.2*   No results for input(s): "LIPASE", "AMYLASE" in the last 168 hours. No results for input(s): "AMMONIA" in the last 168 hours. CBC: Recent Labs  Lab 08/12/23 2353 08/14/23 0633  WBC 14.0* 11.4*  HGB 16.5 14.7  HCT 46.2 42.6  MCV 86.7 87.5  PLT 240 171   Cardiac Enzymes: No results for input(s): "CKTOTAL", "CKMB", "CKMBINDEX", "TROPONINI" in the last 168 hours. BNP: Invalid input(s): "POCBNP" CBG: Recent Labs  Lab 08/13/23 2243  GLUCAP 102*   D-Dimer No results for input(s): "DDIMER" in the last 72 hours. Hgb A1c No results for input(s): "HGBA1C" in the last 72 hours. Lipid Profile No results for input(s): "CHOL", "HDL", "LDLCALC", "TRIG", "CHOLHDL", "LDLDIRECT" in the last 72 hours. Thyroid function studies No results for input(s): "TSH", "T4TOTAL", "T3FREE", "THYROIDAB" in the last 72 hours.  Invalid input(s): "FREET3" Anemia work up No results for input(s): "VITAMINB12", "FOLATE", "FERRITIN", "TIBC", "IRON", "RETICCTPCT" in the last 72 hours. Urinalysis    Component Value Date/Time   COLORURINE YELLOW 08/13/2023 0745   APPEARANCEUR CLEAR 08/13/2023 0745   LABSPEC >1.046 (H) 08/13/2023 0745   PHURINE 5.0 08/13/2023 0745   GLUCOSEU NEGATIVE 08/13/2023 0745   HGBUR NEGATIVE 08/13/2023 0745   BILIRUBINUR NEGATIVE 08/13/2023 0745   KETONESUR NEGATIVE 08/13/2023 0745   PROTEINUR NEGATIVE 08/13/2023 0745   NITRITE NEGATIVE  08/13/2023 0745   LEUKOCYTESUR NEGATIVE 08/13/2023 0745   Sepsis Labs Recent Labs  Lab 08/12/23 2353  08/14/23 0633  WBC 14.0* 11.4*   Microbiology Recent Results (from the past 240 hours)  Resp panel by RT-PCR (RSV, Flu A&B, Covid) Anterior Nasal Swab     Status: None   Collection Time: 08/12/23 11:52 PM   Specimen: Anterior Nasal Swab  Result Value Ref Range Status   SARS Coronavirus 2 by RT PCR NEGATIVE NEGATIVE Final   Influenza A by PCR NEGATIVE NEGATIVE Final   Influenza B by PCR NEGATIVE NEGATIVE Final    Comment: (NOTE) The Xpert Xpress SARS-CoV-2/FLU/RSV plus assay is intended as an aid in the diagnosis of influenza from Nasopharyngeal swab specimens and should not be used as a sole basis for treatment. Nasal washings and aspirates are unacceptable for Xpert Xpress SARS-CoV-2/FLU/RSV testing.  Fact Sheet for Patients: BloggerCourse.com  Fact Sheet for Healthcare Providers: SeriousBroker.it  This test is not yet approved or cleared by the [place] FDA and has been authorized for detection and/or diagnosis of SARS-CoV-2 by FDA under an Emergency Use Authorization (EUA). This EUA will remain in effect (meaning this test can be used) for the duration of the COVID-19 declaration under Section 564(b)(1) of the Act, 21 U.S.C. section 360bbb-3(b)(1), unless the authorization is terminated or revoked.     Resp Syncytial Virus by PCR NEGATIVE NEGATIVE Final    Comment: (NOTE) Fact Sheet for Patients: BloggerCourse.com  Fact Sheet for Healthcare Providers: SeriousBroker.it  This test is not yet approved or cleared by the [place] FDA and has been authorized for detection and/or diagnosis of SARS-CoV-2 by FDA under an Emergency Use Authorization (EUA). This EUA will remain in effect (meaning this test can be used) for the duration of the COVID-19 declaration  under Section 564(b)(1) of the Act, 21 U.S.C. section 360bbb-3(b)(1), unless the authorization is terminated or revoked.  Performed at [facility], [address] [place], [place] [address]   CSF culture w Gram Stain     Status: None (Preliminary result)   Collection Time: 08/14/23 10:45 AM   Specimen: PATH Cytology CSF; Cerebrospinal Fluid  Result Value Ref Range Status   Specimen Description CSF  Final   Special Requests NONE  Final   Gram Stain   Final    WBC PRESENT, PREDOMINANTLY MONONUCLEAR NO ORGANISMS SEEN CYTOSPIN SMEAR Performed at [facility], [address] [place], [place] [address]    Culture PENDING  Incomplete   Report Status PENDING  Incomplete     Time coordinating discharge: 35 minutes  SIGNED:   Glade Lloyd, MD  [facility] 08/14/2023, 1:43 PM

## 2023-08-14 NOTE — Plan of Care (Signed)
  Problem: Activity: Goal: Risk for activity intolerance will decrease Outcome: Progressing   Problem: Safety: Goal: Ability to remain free from injury will improve Outcome: Progressing   Problem: Pain Managment: Goal: General experience of comfort will improve and/or be controlled Outcome: Progressing

## 2023-08-14 NOTE — Progress Notes (Signed)
 ? ?  Inpatient Rehab Admissions Coordinator : ? ?Per therapy recommendations patient was screened for CIR candidacy by Ottie Glazier RN MSN. Patient does not appear to demonstrate the medical neccesity for a Hospital Rehabilitation /CIR admit. I will not place a Rehab Consult. Recommend other Rehab Venues to be pursued. Please contact me with any questions. ? ?Ottie Glazier RN MSN ?Admissions Coordinator ?[phone]  ?

## 2023-08-14 NOTE — Evaluation (Signed)
 Occupational Therapy Evaluation Patient Details Name: Ryan Griffith MRN: [id number] DOB: 03-Nov-1985 Today's Date: 08/14/2023   History of Present Illness   Patient is a 38 y.o. male presented with chest pain and severe headache with left upper extremity weakness/numbness with nausea and was found to be febrile on presentation with extremely elevated blood pressure. CT of head and neck neg. MRI of head and neck neg. LBP performed and neg for meningitis.     Clinical Impressions Prior to admission patient was Independent with adls, taking care of 2 children and working.  Currently is needing Moderate assist with adls from a sit to stand level.  He is demonstrating good strength in the LUE which is his dominant hand, but is requiring increased effort to use the UE in function.  He appeared very stressed, closing eyes and holding his breath throughout the evaluation. Vision needs further assessment.  He also could not weight shift for steps but could boost his hips up the bed with out much difficulty.  Trunk control EOB was normal. Patient will benefit from intensive inpatient follow-up therapy, >3 hours/day and continued OT on acute to facilitate d/c      If plan is discharge home, recommend the following:   A lot of help with walking and/or transfers;A lot of help with bathing/dressing/bathroom;Assistance with cooking/housework;Direct supervision/assist for medications management;Help with stairs or ramp for entrance     Functional Status Assessment   Patient has had a recent decline in their functional status and demonstrates the ability to make significant improvements in function in a reasonable and predictable amount of time.     Equipment Recommendations   None recommended by OT     Recommendations for Other Services   Rehab consult     Precautions/Restrictions   Precautions Precautions: Fall Recall of Precautions/Restrictions: Intact Restrictions Weight Bearing  Restrictions Per Provider Order: No     Mobility Bed Mobility Overal bed mobility: Needs Assistance Bed Mobility: Sit to Supine       Sit to supine: Min assist (to lift his left leg into bed)        Transfers                          Balance                                           ADL either performed or assessed with clinical judgement   ADL Overall ADL's : Needs assistance/impaired         Upper Body Bathing: Minimal assistance;Sitting   Lower Body Bathing: Moderate assistance;+2 for safety/equipment;Sit to/from stand   Upper Body Dressing : Minimal assistance   Lower Body Dressing: Moderate assistance;+2 for safety/equipment   Toilet Transfer: +2 for safety/equipment;Moderate assistance;Stand-pivot;BSC/3in1           Functional mobility during ADLs: Moderate assistance;+2 for safety/equipment General ADL Comments: Patient putting most of his weight on the right and uable to take steps safely forward of back. Closing eyes, holding breath through out session     Vision Baseline Vision/History: 0 No visual deficits Ability to See in Adequate Light: 0 Adequate Patient Visual Report: Eye fatigue/eye pain/headache Vision Assessment?: Vision impaired- to be further tested in functional context Additional Comments: Patient with decreased tracking with L eye to L and gaze appeared disconjugate     Perception  Praxis         Pertinent Vitals/Pain Pain Assessment Pain Assessment: 0-10 Pain Score: 8  Pain Location: headache Pain Descriptors / Indicators: Headache Pain Intervention(s): Limited activity within patient's tolerance     Extremity/Trunk Assessment Upper Extremity Assessment Upper Extremity Assessment: LUE deficits/detail;Left hand dominant LUE Deficits / Details: Patient can move through ROM, no tone increased tone noted, more resistance with range. Upon entering patient was using phone and typing with  both hands.  Overall strength WFL. (IV in L hand prohibiting some movement)   Lower Extremity Assessment Lower Extremity Assessment: RLE deficits/detail;LLE deficits/detail   Cervical / Trunk Assessment Cervical / Trunk Assessment: Normal   Communication Communication Communication: No apparent difficulties   Cognition Arousal: Alert, Lethargic, Suspect due to medications Behavior During Therapy: WFL for tasks assessed/performed Cognition: No apparent impairments             OT - Cognition Comments: Patient not talking much, not making eye contact, holding breath with activiy and indicating frustration                 Following commands: Intact       Cueing  General Comments   Cueing Techniques: Verbal cues      Exercises     Shoulder Instructions      Home Living Family/patient expects to be discharged to:: Private residence Living Arrangements: Children Available Help at Discharge: Family Type of Home: House Home Access: Stairs to enter Secretary/administrator of Steps: 4 Entrance Stairs-Rails: Right;Left Home Layout: One level                   Additional Comments: Buyer, retail      Prior Functioning/Environment Prior Level of Function : Independent/Modified Independent;Driving;Working/employed               ADLs Comments: Caring for children and working    OT Problem List: Decreased activity tolerance;Impaired balance (sitting and/or standing);Impaired UE functional use;Pain   OT Treatment/Interventions: Self-care/ADL training;Therapeutic exercise;Therapeutic activities;Patient/family education;Visual/perceptual remediation/compensation;Balance training      OT Goals(Current goals can be found in the care plan section)   Acute Rehab OT Goals OT Goal Formulation: With patient/family Time For Goal Achievement: 08/28/23 Potential to Achieve Goals: Good   OT Frequency:  Min 2X/week    Co-evaluation PT/OT/SLP  Co-Evaluation/Treatment: Yes Reason for Co-Treatment: Complexity of the patient's impairments (multi-system involvement)          AM-PAC OT "6 Clicks" Daily Activity     Outcome Measure Help from another person eating meals?: A Little Help from another person taking care of personal grooming?: A Little Help from another person toileting, which includes using toliet, bedpan, or urinal?: A Lot Help from another person bathing (including washing, rinsing, drying)?: A Lot Help from another person to put on and taking off regular upper body clothing?: A Lot Help from another person to put on and taking off regular lower body clothing?: A Lot 6 Click Score: 14   End of Session Equipment Utilized During Treatment: Gait belt Nurse Communication: Mobility status  Activity Tolerance: Patient limited by fatigue Patient left: in bed;with call bell/phone within reach;with bed alarm set;with family/visitor present  OT Visit Diagnosis: Unsteadiness on feet (R26.81);Muscle weakness (generalized) (M62.81);Other abnormalities of gait and mobility (R26.89)                Time: 1610-9604 OT Time Calculation (min): 26 min Charges:  OT General Charges $OT Visit: 1 Visit OT Evaluation $OT Eval  Moderate Complexity: 1 Mod OT Treatments $Self Care/Home Management : 8-22 mins  Hal Neer OTR/L   Malachi Bonds 08/14/2023, 4:52 PM

## 2023-08-14 NOTE — Plan of Care (Signed)
 Remain in bed. Complain of light sensitivity. Father at bedside. Complains of headache. Meds ordered. New IV site LFA. Voiding without problems. Supper ordered.   PT/OT ordered. PT in for assessment. SCD's in use    Problem: Education: Goal: Knowledge of General Education information will improve Description: Including pain rating scale, medication(s)/side effects and non-pharmacologic comfort measures Outcome: Progressing   Problem: Activity: Goal: Risk for activity intolerance will decrease Outcome: Progressing   Problem: Coping: Goal: Level of anxiety will decrease Outcome: Progressing   Problem: Safety: Goal: Ability to remain free from injury will improve Outcome: Progressing

## 2023-08-14 NOTE — Procedures (Signed)
 PROCEDURE SUMMARY:  Successful fluoroscopic guided lumbar puncture. No immediate complications.  Pt tolerated well.   EBL = none  Please see full dictation in imaging section of Epic for procedure details.

## 2023-08-14 NOTE — Plan of Care (Signed)
   Problem: Coping: Goal: Level of anxiety will decrease Outcome: Progressing   Problem: Pain Managment: Goal: General experience of comfort will improve and/or be controlled Outcome: Progressing   Problem: Safety: Goal: Ability to remain free from injury will improve Outcome: Progressing

## 2023-08-14 NOTE — Anesthesia Postprocedure Evaluation (Signed)
 Anesthesia Post Note  Patient: Ryan Griffith  Procedure(s) Performed: MRI WITH ANESTHESIA     Patient location during evaluation: PACU Anesthesia Type: General Level of consciousness: awake and alert Pain management: pain level controlled Vital Signs Assessment: post-procedure vital signs reviewed and stable Respiratory status: spontaneous breathing, nonlabored ventilation and respiratory function stable Cardiovascular status: blood pressure returned to baseline and stable Postop Assessment: no apparent nausea or vomiting Anesthetic complications: no   No notable events documented.  Last Vitals:  Vitals:   08/14/23 1216 08/14/23 1517  BP: 121/70 (!) 159/105  Pulse: 99 100  Resp:    Temp: 36.9 C 37.2 C  SpO2: 94% 99%    Last Pain:  Vitals:   08/14/23 1517  TempSrc: Oral  PainSc:                  Collene Schlichter

## 2023-08-14 NOTE — Anesthesia Preprocedure Evaluation (Signed)
 Anesthesia Evaluation  Patient identified by MRN, date of birth, ID band Patient awake    Reviewed: Allergy & Precautions, NPO status , Patient's Chart, lab work & pertinent test results  Airway Mallampati: III  TM Distance: >3 FB Neck ROM: Full    Dental  (+) Teeth Intact, Dental Advisory Given   Pulmonary former smoker   Pulmonary exam normal breath sounds clear to auscultation       Cardiovascular negative cardio ROS Normal cardiovascular exam Rhythm:Regular Rate:Normal     Neuro/Psych Multiple sclerosis  negative psych ROS   GI/Hepatic negative GI ROS, Neg liver ROS,,,  Endo/Other    Class 3 obesity  Renal/GU negative Renal ROS     Musculoskeletal negative musculoskeletal ROS (+)    Abdominal   Peds  Hematology negative hematology ROS (+)   Anesthesia Other Findings Day of surgery medications reviewed with the patient.  Reproductive/Obstetrics                              Anesthesia Physical Anesthesia Plan  ASA: 3  Anesthesia Plan: General   Post-op Pain Management:    Induction: Intravenous  PONV Risk Score and Plan: 2 and Dexamethasone and Ondansetron  Airway Management Planned: Oral ETT and Video Laryngoscope Planned  Additional Equipment:   Intra-op Plan:   Post-operative Plan: Extubation in OR  Informed Consent: I have reviewed the patients History and Physical, chart, labs and discussed the procedure including the risks, benefits and alternatives for the proposed anesthesia with the patient or authorized representative who has indicated his/her understanding and acceptance.     Dental advisory given  Plan Discussed with: CRNA  Anesthesia Plan Comments:          Anesthesia Quick Evaluation

## 2023-08-15 ENCOUNTER — Encounter (HOSPITAL_COMMUNITY): Payer: Self-pay | Admitting: Radiology

## 2023-08-15 LAB — CBC WITH DIFFERENTIAL/PLATELET
Abs Immature Granulocytes: 0.06 10*3/uL (ref 0.00–0.07)
Basophils Absolute: 0 10*3/uL (ref 0.0–0.1)
Basophils Relative: 0 %
Eosinophils Absolute: 0.1 10*3/uL (ref 0.0–0.5)
Eosinophils Relative: 1 %
HCT: 42.5 % (ref 39.0–52.0)
Hemoglobin: 14.8 g/dL (ref 13.0–17.0)
Immature Granulocytes: 1 %
Lymphocytes Relative: 12 %
Lymphs Abs: 1.2 10*3/uL (ref 0.7–4.0)
MCH: 30.3 pg (ref 26.0–34.0)
MCHC: 34.8 g/dL (ref 30.0–36.0)
MCV: 87.1 fL (ref 80.0–100.0)
Monocytes Absolute: 0.9 10*3/uL (ref 0.1–1.0)
Monocytes Relative: 9 %
Neutro Abs: 8.1 10*3/uL — ABNORMAL HIGH (ref 1.7–7.7)
Neutrophils Relative %: 77 %
Platelets: 166 10*3/uL (ref 150–400)
RBC: 4.88 MIL/uL (ref 4.22–5.81)
RDW: 13.2 % (ref 11.5–15.5)
WBC: 10.4 10*3/uL (ref 4.0–10.5)
nRBC: 0 % (ref 0.0–0.2)

## 2023-08-15 LAB — COMPREHENSIVE METABOLIC PANEL
ALT: 32 U/L (ref 0–44)
AST: 17 U/L (ref 15–41)
Albumin: 3.2 g/dL — ABNORMAL LOW (ref 3.5–5.0)
Alkaline Phosphatase: 43 U/L (ref 38–126)
Anion gap: 10 (ref 5–15)
BUN: 7 mg/dL (ref 6–20)
CO2: 26 mmol/L (ref 22–32)
Calcium: 8.5 mg/dL — ABNORMAL LOW (ref 8.9–10.3)
Chloride: 99 mmol/L (ref 98–111)
Creatinine, Ser: 1.03 mg/dL (ref 0.61–1.24)
GFR, Estimated: >60 mL/min (ref >60)
Glucose, Bld: 92 mg/dL (ref 70–99)
Potassium: 3.7 mmol/L (ref 3.5–5.1)
Sodium: 135 mmol/L (ref 135–145)
Total Bilirubin: 0.7 mg/dL (ref 0.0–1.2)
Total Protein: 6.3 g/dL — ABNORMAL LOW (ref 6.5–8.1)

## 2023-08-15 LAB — MAGNESIUM: Magnesium: 2.4 mg/dL (ref 1.7–2.4)

## 2023-08-15 MED ORDER — ASPIRIN-ACETAMINOPHEN-CAFFEINE 250-250-65 MG PO TABS: 1.0000 | ORAL_TABLET | Freq: Four times a day (QID) | ORAL | Status: DC | PRN

## 2023-08-15 MED ORDER — TRAMADOL HCL 50 MG PO TABS
50.0000 mg | ORAL_TABLET | Freq: Four times a day (QID) | ORAL | Status: DC | PRN
  Administered 2023-08-15: 50 mg via ORAL
  Filled 2023-08-15: qty 1

## 2023-08-15 NOTE — Progress Notes (Signed)
 Occupational Therapy Treatment Patient Details Name: Ryan Griffith MRN: [id number] DOB: 04-12-1986 Today's Date: 08/15/2023   History of present illness Patient is a 38 y.o. male presented with chest pain and severe headache with left upper extremity weakness/numbness with nausea and was found to be febrile on presentation with extremely elevated blood pressure. CT of head and neck neg. MRI of head and neck neg. LBP performed and neg for meningitis.   OT comments  Patient with significant improvement today.  Independent with bed mobility, sit to stand and ambulated to the sink to do grooming.  Patient amb around bed to chair and dressed upper and lower body independently from sit -- stand level.  No issues with gross and fine motor noted with adls.  No further OT indicated and no equi;ment recommended. for d/c home.  Girlfriend with be available to supervise patient at home.       If plan is discharge home, recommend the following:  A little help with bathing/dressing/bathroom   Equipment Recommendations  None recommended by OT    Recommendations for Other Services      Precautions / Restrictions Precautions Precautions: Fall Recall of Precautions/Restrictions: Intact Restrictions Weight Bearing Restrictions Per Provider Order: No       Mobility Bed Mobility Overal bed mobility: Independent                  Transfers Overall transfer level: Independent                       Balance Overall balance assessment: No apparent balance deficits (not formally assessed)                                         ADL either performed or assessed with clinical judgement   ADL Overall ADL's : Independent                                            Extremity/Trunk Assessment Upper Extremity Assessment Upper Extremity Assessment: Overall WFL for tasks assessed   Lower Extremity Assessment Lower Extremity Assessment: Overall  WFL for tasks assessed        Vision   Vision Assessment?: No apparent visual deficits   Perception     Praxis     Communication Communication Communication: No apparent difficulties   Cognition Arousal: Alert Behavior During Therapy: WFL for tasks assessed/performed Cognition: No apparent impairments             OT - Cognition Comments: patient interactive and making eye contact, joking                 Following commands: Intact        Cueing      Exercises      Shoulder Instructions       General Comments      Pertinent Vitals/ Pain       Pain Assessment Pain Assessment: 0-10 Pain Score: 4  Pain Location: headache Pain Descriptors / Indicators: Headache Pain Intervention(s): Monitored during session  Home Living  Prior Functioning/Environment              Frequency  Min 2X/week        Progress Toward Goals  OT Goals(current goals can now be found in the care plan section)  Progress towards OT goals: Goals met/education completed, patient discharged from OT     Plan      Co-evaluation                 AM-PAC OT "6 Clicks" Daily Activity     Outcome Measure   Help from another person eating meals?: None Help from another person taking care of personal grooming?: None Help from another person toileting, which includes using toliet, bedpan, or urinal?: None Help from another person bathing (including washing, rinsing, drying)?: None Help from another person to put on and taking off regular upper body clothing?: None Help from another person to put on and taking off regular lower body clothing?: None 6 Click Score: 24    End of Session    OT Visit Diagnosis: Unsteadiness on feet (R26.81);Muscle weakness (generalized) (M62.81);Other abnormalities of gait and mobility (R26.89)   Activity Tolerance Patient tolerated treatment well   Patient Left in chair;with  call bell/phone within reach;with family/visitor present   Nurse Communication Mobility status        Time: 1035-1101 OT Time Calculation (min): 26 min  Charges: OT General Charges $OT Visit: 1 Visit OT Treatments $Self Care/Home Management : 23-37 mins  Hal Neer OTR/L   Malachi Bonds 08/15/2023, 11:13 AM

## 2023-08-15 NOTE — Progress Notes (Signed)
 Physical Therapy Discharge Note Patient Details Name: Ryan Griffith MRN: [id number] DOB: 06-15-1985 Today's Date: 08/15/2023   History of Present Illness Patient is a 38 y.o. male presented with chest pain and severe headache with left upper extremity weakness/numbness with nausea and was found to be febrile on presentation with extremely elevated blood pressure. CT of head and neck neg. MRI of head and neck neg. LBP performed and neg for meningitis.    PT Comments  Pt has met all of his goals. Currently ind with gait, stairs and sit to stand without an AD. Pt demonstrates anteriorly rolled shoulders and forward head posture; educated to try scapular retractions to decrease strain on low back due to pain from lumbar puncture. Discussed asking MD if he needs any follow up as and getting a PCP after discharge within PT scope of practice. Currently pt is presenting at baseline level of functioning and no skilled physical therapy services recommended. Pt will be discharged from skilled physical therapy services at this time; please re-consult if further needs arise.        If plan is discharge home, recommend the following: Other (comment) (as needed)     Equipment Recommendations  None recommended by PT       Precautions / Restrictions Precautions Precautions: Fall Recall of Precautions/Restrictions: Intact Restrictions Weight Bearing Restrictions Per Provider Order: No     Mobility  Bed Mobility   General bed mobility comments: pt sitting in recliner beginning of session and left standing in room with father at end of session    Transfers Overall transfer level: Independent Equipment used: None Transfers: Sit to/from Stand Sit to Stand: Independent    Ambulation/Gait Ambulation/Gait assistance: Independent Gait Distance (Feet): 400 Feet Assistive device: None Gait Pattern/deviations: WFL(Within Functional Limits) Gait velocity: WNL Gait velocity interpretation: >2.62  ft/sec, indicative of community ambulatory   General Gait Details: minimal arm swing; pt reports this is his baseline   Stairs Stairs: Yes Stairs assistance: Independent Stair Management: No rails, Forwards, Alternating pattern Number of Stairs: 4         Balance Overall balance assessment: No apparent balance deficits (not formally assessed) Sitting-balance support: Feet supported Sitting balance-Leahy Scale: Normal     Standing balance support: Single extremity supported, No upper extremity supported Standing balance-Leahy Scale: Good      Communication Communication Communication: No apparent difficulties  Cognition Arousal: Alert Behavior During Therapy: WFL for tasks assessed/performed   PT - Cognitive impairments: No apparent impairments       Following commands: Intact      Cueing Cueing Techniques: Verbal cues     General Comments General comments (skin integrity, edema, etc.): No signs/symptoms of cardiac/respiratory distress during session.      Pertinent Vitals/Pain Pain Assessment Pain Assessment: Faces Faces Pain Scale: Hurts a little bit Pain Location: back Pain Descriptors / Indicators: Aching Pain Intervention(s): Monitored during session     PT Goals (current goals can now be found in the care plan section) Progress towards PT goals: Goals met/education completed, patient discharged from PT               AM-PAC PT "6 Clicks" Mobility   Outcome Measure  Help needed turning from your back to your side while in a flat bed without using bedrails?: None Help needed moving from lying on your back to sitting on the side of a flat bed without using bedrails?: None Help needed moving to and from a bed to a chair (including  a wheelchair)?: None Help needed standing up from a chair using your arms (e.g., wheelchair or bedside chair)?: None Help needed to walk in hospital room?: None Help needed climbing 3-5 steps with a railing? : None 6  Click Score: 24    End of Session Equipment Utilized During Treatment: Gait belt Activity Tolerance: Patient tolerated treatment well Patient left: Other (comment);with family/visitor present (in room with father) Nurse Communication: Mobility status Pain - part of body:  (back)     Time: 1130-1141 PT Time Calculation (min) (ACUTE ONLY): 11 min  Charges:    $Therapeutic Activity: 8-22 mins PT General Charges $$ ACUTE PT VISIT: 1 Visit                     Harrel Carina, DPT, CLT  Acute Rehabilitation Services Office: [phone] (Secure chat preferred)    Claudia Desanctis 08/15/2023, 11:47 AM

## 2023-08-15 NOTE — ED Provider Notes (Signed)
 [facility] Provider Note   CSN: [id number] Arrival date & time: 08/12/23  2340     History  Chief Complaint  Patient presents with   Chest Pain    Ryan Griffith is a 38 y.o. male.  38 year old male without significant past medical history who presents ER today with extreme pain.  Patient states he has severe pain in his chest that radiates towards his back and has new onset left-sided numbness.  States that started about 3 hours prior to arrival and is perfectly normal before that.  Has a headache with a 2.  Patient denies any recent illnesses, fever, cough or other associated symptoms.  Never had that this before.  Denies any alcohol, or drug use.  No trauma.  EMS brought him and his blood pressure was 230/130 with them.  He states he thought it was 280 at 1 point.   Chest Pain      Home Medications Prior to Admission medications   Medication Sig Start Date End Date Taking? Authorizing Provider  ibuprofen (ADVIL) 400 MG tablet Take 1 tablet (400 mg total) by mouth every 6 (six) hours as needed for headache, fever, mild pain (pain score 1-3) or moderate pain (pain score 4-6). 08/14/23  Yes Glade Lloyd, MD  polyethylene glycol (MIRALAX / GLYCOLAX) 17 g packet Take 17 g by mouth daily as needed for moderate constipation. 08/14/23   Glade Lloyd, MD      Allergies    Patient has no known allergies.    Review of Systems   Review of Systems  Cardiovascular:  Positive for chest pain.    Physical Exam Updated Vital Signs BP 125/75 (BP Location: Right Arm)   Pulse 98   Temp 98.1 F (36.7 C) (Oral)   Resp 16   Ht 6\' 2"  (1.88 m)   Wt (!) 145.2 kg   SpO2 96%   BMI 41.09 kg/m  Physical Exam Vitals and nursing note reviewed.  Constitutional:      Appearance: He is well-developed.  HENT:     Head: Normocephalic and atraumatic.  Cardiovascular:     Rate and Rhythm: Normal rate.     Comments: Hypertensive, tachycardic, symmetric radial  pulses, no abnormal heart sounds. Pulmonary:     Effort: Pulmonary effort is normal. No respiratory distress.  Abdominal:     General: There is no distension.  Musculoskeletal:        General: Normal range of motion.     Cervical back: Normal range of motion.  Neurological:     Mental Status: He is alert.     Comments: Motor intact.  Did not walk him however moving all of his extremities with good strength.  However he has decreased sensation in his left arm.  Significant nailbed pressure does not cause any reaction or pain to him.  He does feel mild pain pinching his AC fossa.  He also has some paresthesias in his left leg.  Extraocular movements intact.  Pupils are equal round reactive to light.  He is oriented.  No obvious facial asymmetry.     ED Results / Procedures / Treatments   Labs (all labs ordered are listed, but only abnormal results are displayed) Labs Reviewed  BASIC METABOLIC PANEL - Abnormal; Notable for the following components:      Result Value   Sodium 134 (*)    Glucose, Bld 100 (*)    All other components within normal limits  CBC - Abnormal; Notable  for the following components:   WBC 14.0 (*)    All other components within normal limits  URINALYSIS, ROUTINE W REFLEX MICROSCOPIC - Abnormal; Notable for the following components:   Specific Gravity, Urine >1.046 (*)    All other components within normal limits  RAPID URINE DRUG SCREEN, HOSP PERFORMED - Abnormal; Notable for the following components:   Opiates POSITIVE (*)    All other components within normal limits  CBC - Abnormal; Notable for the following components:   WBC 11.4 (*)    All other components within normal limits  COMPREHENSIVE METABOLIC PANEL - Abnormal; Notable for the following components:   Sodium 133 (*)    Glucose, Bld 100 (*)    Calcium 8.1 (*)    Total Protein 6.1 (*)    Albumin 3.2 (*)    All other components within normal limits  GLUCOSE, CAPILLARY - Abnormal; Notable for the  following components:   Glucose-Capillary 102 (*)    All other components within normal limits  PROTEIN, CSF - Abnormal; Notable for the following components:   Total  Protein, CSF 52 (*)    All other components within normal limits  CSF CELL COUNT WITH DIFFERENTIAL - Abnormal; Notable for the following components:   RBC Count, CSF 5 (*)    All other components within normal limits  RESP PANEL BY RT-PCR (RSV, FLU A&B, COVID)  RVPGX2  CSF CULTURE W GRAM STAIN  HIV ANTIBODY (ROUTINE TESTING W REFLEX)  GLUCOSE, CSF  MENINGITIS/ENCEPHALITIS PANEL (CSF)  CBC WITH DIFFERENTIAL/PLATELET  COMPREHENSIVE METABOLIC PANEL  MAGNESIUM  TROPONIN I (HIGH SENSITIVITY)  TROPONIN I (HIGH SENSITIVITY)    EKG EKG Interpretation Date/Time:  Tuesday August 12 2023 23:41:32 EDT Ventricular Rate:  130 PR Interval:  134 QRS Duration:  70 QT Interval:  286 QTC Calculation: 420 R Axis:   63  Text Interpretation: Sinus tachycardia Otherwise normal ECG When compared with ECG of 10-Jan-2020 14:06, HEART RATE has increased Confirmed by Dione Booze ([id number]) on 08/12/2023 11:48:13 PM  Radiology IR LUMBAR PUNCTURE Result Date: 08/14/2023 CLINICAL DATA:  Patient is a 38 y/o male with headaches, left sided weakness and fevers. IR consulted for a diagnostic lumbar puncture with anesthesia. EXAM: LUMBAR PUNCTURE UNDER FLUOROSCOPY PROCEDURE: An appropriate skin entry site was determined fluoroscopically. Operator donned sterile gloves and mask. Skin site was marked, then prepped with Betadine, draped in usual sterile fashion, and infiltrated locally with 1% lidocaine. A 6 inch 20 gauge spinal needle was advanced into the thecal sac at L4-L5 from a left interlaminar approach. Clear colorless CSF spontaneously returned, with an opening pressure of 27 cm water (measured prone). 11 mL CSF were collected and divided among 4 sterile vials for the requested laboratory studies. The needle was then removed. The patient tolerated  the procedure well and there were no complications. This exam was performed by [facility], and was supervised and interpreted by Dr. Sebastian Ache. FLUOROSCOPY: Radiation Exposure Index (as provided by the fluoroscopic device): 150 mGy Kerma IMPRESSION: Technically successful lumbar puncture under fluoroscopy. Electronically Signed   By: Sebastian Ache M.D.   On: 08/14/2023 12:45   MR Cervical Spine W and Wo Contrast Result Date: 08/14/2023 CLINICAL DATA:  Left-sided weakness, headache, and fever. EXAM: MRI CERVICAL SPINE WITHOUT AND WITH CONTRAST TECHNIQUE: Multiplanar and multiecho pulse sequences of the cervical spine, to include the craniocervical junction and cervicothoracic junction, were obtained without and with intravenous contrast. CONTRAST:  10mL GADAVIST GADOBUTROL 1 MMOL/ML IV SOLN  COMPARISON:  Cervical spine MRI 01/10/2020 FINDINGS: Alignment: Normal. Vertebrae: No fracture, suspicious marrow lesion, significant marrow edema, or evidence of discitis. Cord: Normal signal and morphology.  No abnormal enhancement. Posterior Fossa, vertebral arteries, paraspinal tissues: Fluid/secretions in the pharynx in the setting of intubation. Preserved vertebral artery flow voids. Disc levels: Preserved disc heights.  No disc herniation or stenosis. IMPRESSION: Negative cervical spine MRI. Electronically Signed   By: Sebastian Ache M.D.   On: 08/14/2023 10:03   MR Brain W and Wo Contrast Result Date: 08/14/2023 CLINICAL DATA:  Left-sided weakness, headache, and fever. EXAM: MRI HEAD WITHOUT AND WITH CONTRAST TECHNIQUE: Multiplanar, multiecho pulse sequences of the brain and surrounding structures were obtained without and with intravenous contrast. CONTRAST:  10mL GADAVIST GADOBUTROL 1 MMOL/ML IV SOLN COMPARISON:  Head CT and CTA 08/13/2023 FINDINGS: Brain: There is no evidence of an acute infarct, intracranial hemorrhage, mass, midline shift, or extra-axial fluid collection. Cerebral volume is normal.  The ventricles are normal in size. The cerebellar tonsils are normally positioned. There are 8-10 punctate foci of T2 FLAIR hyperintensity primarily involving the subcortical white matter of the frontal lobes. There is no significant periventricular involvement. The corpus callosum, brainstem, and cerebellum are normal in signal. No abnormal brain parenchymal or meningeal enhancement is identified. Vascular: Major intracranial vascular flow voids are preserved. Normal enhancement of the dural venous sinuses. Skull and upper cervical spine: Unremarkable bone marrow signal. Sinuses/Orbits: Unremarkable orbits. Minimal mucosal thickening in the paranasal sinuses. Clear mastoid air cells. Other: Fluid in the pharynx related to intubation. IMPRESSION: 1. No acute intracranial abnormality. 2. Minimal cerebral white matter T2 signal changes, nonspecific though may reflect early chronic small vessel ischemia, migraines, or prior infection/inflammation. No specific findings of demyelinating disease. Electronically Signed   By: Sebastian Ache M.D.   On: 08/14/2023 09:59    Procedures .Critical Care  Performed by: Marily Memos, MD Authorized by: Marily Memos, MD   Critical care provider statement:    Critical care time (minutes):  30   Critical care was necessary to treat or prevent imminent or life-threatening deterioration of the following conditions:  Cardiac failure, circulatory failure and CNS failure or compromise   Critical care was time spent personally by me on the following activities:  Development of treatment plan with patient or surrogate, discussions with consultants, evaluation of patient's response to treatment, examination of patient, ordering and review of laboratory studies, ordering and review of radiographic studies, ordering and performing treatments and interventions, pulse oximetry, re-evaluation of patient's condition and review of old charts     Medications Ordered in ED Medications   acetaminophen (TYLENOL) tablet 650 mg ( Oral MAR Unhold 08/14/23 1217)  sodium chloride flush (NS) 0.9 % injection 3 mL (3 mLs Intravenous Given 08/14/23 2218)  ondansetron (ZOFRAN) tablet 4 mg ( Oral MAR Unhold 08/14/23 1217)    Or  ondansetron (ZOFRAN) injection 4 mg ( Intravenous MAR Unhold 08/14/23 1217)  0.9 %  sodium chloride infusion (0 mLs Intravenous Stopped 08/14/23 1350)  guaiFENesin-dextromethorphan (ROBITUSSIN DM) 100-10 MG/5ML syrup 10 mL ( Oral MAR Unhold 08/14/23 1217)  chlorhexidine (PERIDEX) 0.12 % solution (has no administration in time range)  amisulpride (BARHEMSYS) 5 MG/2ML injection (has no administration in time range)  senna-docusate (Senokot-S) tablet 1 tablet (1 tablet Oral Given 08/14/23 2218)  polyethylene glycol (MIRALAX / GLYCOLAX) packet 17 g (has no administration in time range)  ondansetron (ZOFRAN) injection 4 mg (4 mg Intravenous Given 08/12/23 2349)  HYDROmorphone (DILAUDID) injection 1  mg (1 mg Intravenous Given 08/13/23 0048)  iohexol (OMNIPAQUE) 350 MG/ML injection 100 mL (100 mLs Intravenous Contrast Given 08/13/23 0046)  HYDROmorphone (DILAUDID) injection 1 mg (1 mg Intravenous Given 08/13/23 0259)  ketorolac (TORADOL) 30 MG/ML injection 15 mg (15 mg Intravenous Given 08/13/23 0258)  lactated ringers bolus 1,000 mL (0 mLs Intravenous Stopped 08/13/23 1544)  LORazepam (ATIVAN) injection 2 mg (2 mg Intravenous Given 08/13/23 0434)  vancomycin (VANCOCIN) 2,500 mg in sodium chloride 0.9 % 500 mL IVPB (0 mg Intravenous Stopped 08/13/23 2313)  amisulpride (BARHEMSYS) injection 10 mg (10 mg Intravenous Given 08/14/23 1141)  gadobutrol (GADAVIST) 1 MMOL/ML injection 10 mL (10 mLs Intravenous Contrast Given 08/14/23 0947)  prochlorperazine (COMPAZINE) injection 10 mg (10 mg Intravenous Given 08/14/23 1614)  diphenhydrAMINE (BENADRYL) injection 25 mg (25 mg Intravenous Given 08/14/23 1614)  magnesium sulfate IVPB 2 g 50 mL (2 g Intravenous New Bag/Given 08/14/23 1613)  sodium  chloride 0.9 % bolus 1,000 mL (1,000 mLs Intravenous New Bag/Given 08/14/23 1611)  valproate (DEPACON) 500 mg in dextrose 5 % 50 mL IVPB (500 mg Intravenous New Bag/Given 08/14/23 1900)    ED Course/ Medical Decision Making/ A&P                                 Medical Decision Making Amount and/or Complexity of Data Reviewed Labs: ordered. Radiology: ordered.  Risk Prescription drug management. Decision regarding hospitalization.   Patient is significant hypertensive and tachycardic with chest pain, back pain and a neurologic deficit.  Discussed quickly with neurology about whether which she may, code stroke or pursue aortic pathology first.  He agreed with aortic workup.  CT angio head neck chest abdomen pelvis was done emergently prior to labs however on my interpretation this was normal.  It was also noted that he had a temperature of 100.4.  He has no infectious symptoms.  No nuchal rigidity suggest meningitis.  This seems to may be more of a SIRS response related to what ever is causing the pain.  Lipase was normal and no obvious pancreatitis on CT scan.  Discussed with neurology again with the negative studies and recommended MRI.  Attempted MRI with Ativan as the patient is claustrophobic however patient was unable to tolerate it.  Once again discussed with neurology and felt that it was important to get the MRI to rule out stroke or MS especially in the setting of a new neurologic deficit.  Will need a sedated MRI which requires admission to the hospital and anesthesia monitoring so I paged hospitalist for admission.  Transferred pending hospitalist callback.  Final Clinical Impression(s) / ED Diagnoses Final diagnoses:  Left arm numbness  Left leg numbness  Fever, unspecified fever cause    Rx / DC Orders ED Discharge Orders          Ordered    polyethylene glycol (MIRALAX / GLYCOLAX) 17 g packet  Daily PRN        08/14/23 1340    ibuprofen (ADVIL) 400 MG tablet  Every 6  hours PRN        08/14/23 1340    Increase activity slowly        08/14/23 1341    Diet general        08/14/23 1341              Gracious Renken, Barbara Cower, MD 08/15/23 [phone]

## 2023-08-15 NOTE — Final Progress Note (Signed)
Discharged to home after IV access removed and discharge instructions given.  All questions answered.   ?

## 2023-08-15 NOTE — Plan of Care (Signed)

## 2023-08-15 NOTE — Progress Notes (Signed)
 Patient was supposed to be discharged on 08/14/2023 but discharge was held because of persistent weakness and headache.  PT recommended CIR placement.  As per CIR, patient is not a candidate for CIR placement.   Patient seen and examined at bedside and plan of care discussed with him.  He is currently medically stable for discharge.  He is still having intermittent headaches but feels better and hoping to go home today.  Discharge to home today.  Please refer to the full discharge summary done by me on 08/14/2023 for full details.

## 2023-08-17 LAB — CSF CULTURE W GRAM STAIN: Culture: NO GROWTH
